# Patient Record
Sex: Male | Born: 2000 | Race: White | Hispanic: No | Marital: Single | Smoking: Never smoker
Health system: Southern US, Community
[De-identification: ages and names within clinical notes are randomized; demographics above are authoritative.]

## PROBLEM LIST (undated history)

## (undated) DIAGNOSIS — J309 Allergic rhinitis, unspecified: Secondary | ICD-10-CM

## (undated) DIAGNOSIS — K219 Gastro-esophageal reflux disease without esophagitis: Secondary | ICD-10-CM

## (undated) DIAGNOSIS — M2141 Flat foot [pes planus] (acquired), right foot: Secondary | ICD-10-CM

## (undated) DIAGNOSIS — M2142 Flat foot [pes planus] (acquired), left foot: Secondary | ICD-10-CM

## (undated) HISTORY — DX: Allergic rhinitis, unspecified: J30.9

## (undated) HISTORY — PX: ADENOIDECTOMY: SUR15

## (undated) HISTORY — DX: Flat foot (pes planus) (acquired), right foot: M21.42

## (undated) HISTORY — DX: Flat foot (pes planus) (acquired), right foot: M21.41

## (undated) HISTORY — DX: Gastro-esophageal reflux disease without esophagitis: K21.9

---
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
## 2022-01-03 DIAGNOSIS — J3081 Allergic rhinitis due to animal (cat) (dog) hair and dander: Secondary | ICD-10-CM | POA: Diagnosis not present

## 2022-01-03 DIAGNOSIS — J3089 Other allergic rhinitis: Secondary | ICD-10-CM | POA: Diagnosis not present

## 2022-01-03 DIAGNOSIS — J301 Allergic rhinitis due to pollen: Secondary | ICD-10-CM | POA: Diagnosis not present

## 2022-01-20 ENCOUNTER — Other Ambulatory Visit (HOSPITAL_COMMUNITY): Payer: Self-pay

## 2022-01-21 ENCOUNTER — Other Ambulatory Visit (HOSPITAL_COMMUNITY): Payer: Self-pay

## 2022-01-22 ENCOUNTER — Other Ambulatory Visit (HOSPITAL_COMMUNITY): Payer: Self-pay

## 2022-01-30 ENCOUNTER — Other Ambulatory Visit (HOSPITAL_COMMUNITY): Payer: Self-pay

## 2022-03-17 ENCOUNTER — Ambulatory Visit (INDEPENDENT_AMBULATORY_CARE_PROVIDER_SITE_OTHER): Payer: 59

## 2022-03-17 ENCOUNTER — Other Ambulatory Visit (HOSPITAL_BASED_OUTPATIENT_CLINIC_OR_DEPARTMENT_OTHER): Payer: Self-pay

## 2022-03-17 ENCOUNTER — Ambulatory Visit: Payer: 59 | Admitting: Podiatry

## 2022-03-17 DIAGNOSIS — M2141 Flat foot [pes planus] (acquired), right foot: Secondary | ICD-10-CM | POA: Diagnosis not present

## 2022-03-17 DIAGNOSIS — M2142 Flat foot [pes planus] (acquired), left foot: Secondary | ICD-10-CM

## 2022-03-17 DIAGNOSIS — M722 Plantar fascial fibromatosis: Secondary | ICD-10-CM

## 2022-03-17 MED ORDER — INFLUENZA VAC SPLIT QUAD 0.5 ML IM SUSY
PREFILLED_SYRINGE | INTRAMUSCULAR | 0 refills | Status: DC
  Filled 2022-03-17: qty 0.5, 1d supply, fill #0

## 2022-03-17 NOTE — Progress Notes (Signed)
dg 

## 2022-03-17 NOTE — Progress Notes (Signed)
   Chief Complaint  Patient presents with   Foot Pain    Patient is here for bilateral foot pain, patient has orthotics and needs new ones.    Subjective:  21 y.o. male presenting today as a new patient with his father for evaluation of bilateral flatfeet.  He has worn orthotics in the past but his orthotics are about 19 years old and several shoe sizes smaller.  He is now in college and he has pain and tenderness associated to a longstanding history of flatfeet.  His father also has flatfeet.  Presenting for further treatment and evaluation   No past medical history on file.     Objective/Physical Exam General: The patient is alert and oriented x3 in no acute distress.  Dermatology: Skin is warm, dry and supple bilateral lower extremities. Negative for open lesions or macerations.  Vascular: Palpable pedal pulses bilaterally. No edema or erythema noted. Capillary refill within normal limits.  Neurological: Epicritic and protective threshold grossly intact bilaterally.   Musculoskeletal Exam: Range of motion within normal limits to all pedal and ankle joints bilateral. Muscle strength 5/5 in all groups bilateral.  Upon weightbearing there is a medial longitudinal arch collapse bilaterally. Remove foot valgus noted to the bilateral lower extremities with excessive pronation upon mid stance.  Radiographic Exam B/L ankles number:  Normal osseous mineralization. Joint spaces preserved. No fracture/dislocation/boney destruction.   Pes planus noted on radiographic exam lateral views. Decreased calcaneal inclination and metatarsal declination angle is noted. Anterior break in the cyma line noted on lateral views.   Assessment: 1.  Reducible/flexible pes planus bilateral   Plan of Care:  1. Patient was evaluated. X-Rays reviewed.  2.  Today the patient was molded for custom molded orthotics 3.  Continue wearing good supportive shoes and sneakers 4.  Return to clinic as needed  *goes  to [employer]. Father is a Software engineer for call   Edrick Kins, DPM [facility]  Dr. Edrick Kins, DPM    To the [address], [place] [address]                Office [phone]  Fax [phone]      Goes to [employer]. Dad is a Software engineer for [employer]

## 2022-03-22 ENCOUNTER — Other Ambulatory Visit (HOSPITAL_BASED_OUTPATIENT_CLINIC_OR_DEPARTMENT_OTHER): Payer: Self-pay

## 2022-04-07 ENCOUNTER — Telehealth: Payer: Self-pay | Admitting: Podiatry

## 2022-04-07 NOTE — Telephone Encounter (Signed)
Left message on cell for pt to call back to schedule appt for picking up orthotics,  Hm # busy signal

## 2022-04-16 ENCOUNTER — Ambulatory Visit (INDEPENDENT_AMBULATORY_CARE_PROVIDER_SITE_OTHER): Payer: 59 | Admitting: *Deleted

## 2022-04-16 DIAGNOSIS — M2141 Flat foot [pes planus] (acquired), right foot: Secondary | ICD-10-CM

## 2022-04-16 NOTE — Progress Notes (Signed)
Patient presents today to pick up custom molded foot orthotics, diagnosed with pes planus by Dr. Logan Bores.   Orthotics were dispensed and fit was satisfactory. Reviewed instructions for break-in and wear. Written instructions given to patient.  Patient will follow up as needed.

## 2022-05-06 ENCOUNTER — Other Ambulatory Visit (HOSPITAL_COMMUNITY): Payer: Self-pay

## 2022-07-24 ENCOUNTER — Other Ambulatory Visit (HOSPITAL_COMMUNITY): Payer: Self-pay

## 2022-12-12 ENCOUNTER — Other Ambulatory Visit (HOSPITAL_COMMUNITY): Payer: Self-pay

## 2022-12-12 MED ORDER — PREVIDENT 5000 BOOSTER PLUS 1.1 % DT PSTE
PASTE | DENTAL | 99 refills | Status: AC
  Filled 2022-12-12: qty 100, 30d supply, fill #0

## 2023-01-07 ENCOUNTER — Ambulatory Visit: Payer: Commercial Managed Care - PPO | Admitting: Family Medicine

## 2023-01-07 ENCOUNTER — Encounter: Payer: Self-pay | Admitting: Family Medicine

## 2023-01-07 VITALS — BP 111/75 | HR 80 | Temp 98.3°F | Ht 74.0 in | Wt 209.4 lb

## 2023-01-07 DIAGNOSIS — Z0000 Encounter for general adult medical examination without abnormal findings: Secondary | ICD-10-CM

## 2023-01-07 DIAGNOSIS — J309 Allergic rhinitis, unspecified: Secondary | ICD-10-CM | POA: Insufficient documentation

## 2023-01-07 NOTE — Progress Notes (Signed)
Office Note 01/07/2023  CC:  Chief Complaint  Patient presents with   Establish Care    HPI:  Frederick Ramirez is a 22 y.o. male who is here to establish care and cpe. Patient's most recent primary MD: Dr. Norris Cross.  His allergist is Dr. Aris Georgia Old records were reviewed prior to or during today's visit.  Feeling well. Sophomore at [employer], living off campus.  Mechanical engineering major. No T/A/Ds.   Past Medical History:  Diagnosis Date   Allergic rhinitis    GERD (gastroesophageal reflux disease)    Pes planus of both feet     Past Surgical History:  Procedure Laterality Date   ADENOIDECTOMY     approx 2010    Family History  Problem Relation Age of Onset   Arthritis Mother    Arthritis Father     Social History   Socioeconomic History   Marital status: Single    Spouse name: Not on file   Number of children: Not on file   Years of education: Not on file   Highest education level: Not on file  Occupational History   Not on file  Tobacco Use   Smoking status: Never   Smokeless tobacco: Never  Substance and Sexual Activity   Alcohol use: Not Currently   Drug use: Never   Sexual activity: Never  Other Topics Concern   Not on file  Social History Narrative   Archivist   Social Determinants of Health   Financial Resource Strain: Not on file  Food Insecurity: Not on file  Transportation Needs: Not on file  Physical Activity: Not on file  Stress: Not on file  Social Connections: Not on file  Intimate Partner Violence: Not on file    Outpatient Encounter Medications as of 01/07/2023  Medication Sig   azelastine (ASTELIN) 0.1 % nasal spray Use 1 spray in each nostril twice a day (Patient taking differently: Place into both nostrils as needed.)   desloratadine (CLARINEX) 5 MG tablet Take 5 mg by mouth.   EPINEPHrine 0.3 mg/0.3 mL IJ SOAJ injection Inject 1 syringe into the outer thigh as needed for anaphylaxis   Fluticasone  Propionate (FLONASE NA) Place 1 spray into the nose as needed.   Sodium Fluoride (PREVIDENT 5000 BOOSTER PLUS) 1.1 % PSTE Brush on teeth twice daily for 2 minutes each time. Spit out as mush as possible and do not swish, eat or drink for 30 minutes after treatment.   [DISCONTINUED] Azelastine HCl 0.15 % SOLN Use 1-2 sprays in each nostril 2 times a day (Patient not taking: Reported on 01/07/2023)   [DISCONTINUED] ciclesonide (OMNARIS) 50 MCG/ACT nasal spray 2 sprays by Each Nare route daily. (Patient not taking: Reported on 01/07/2023)   [DISCONTINUED] COVID-19 mRNA Vac-TriS, Pfizer, (PFIZER-BIONT COVID-19 VAC-TRIS) SUSP injection Inject into the muscle. (Patient not taking: Reported on 01/07/2023)   [DISCONTINUED] desloratadine (CLARINEX) 5 MG tablet Take 1 tablet by mouth once a day (Patient not taking: Reported on 01/07/2023)   [DISCONTINUED] desloratadine (CLARINEX) 5 MG tablet Take 1 tablet (5 mg total) by mouth daily. (Patient not taking: Reported on 01/07/2023)   [DISCONTINUED] influenza vac split quadrivalent PF (FLUARIX) 0.5 ML injection Inject into the muscle. (Patient not taking: Reported on 01/07/2023)   [DISCONTINUED] influenza vac split quadrivalent PF (FLUARIX) 0.5 ML injection Inject into the muscle. (Patient not taking: Reported on 01/07/2023)   [DISCONTINUED] meningococcal B (BEXSERO) SUSY injection Inject 0.5 mLs into the muscle. (Patient not taking: Reported on 01/07/2023)   [  DISCONTINUED] montelukast (SINGULAIR) 5 MG chewable tablet Chew 5 mg by mouth. (Patient not taking: Reported on 01/07/2023)   [DISCONTINUED] Olopatadine HCl 0.6 % SOLN Use 1 to 2  sprays in each nostril twice daily (Patient not taking: Reported on 01/07/2023)   No facility-administered encounter medications on file as of 01/07/2023.    No Known Allergies  Review of Systems  Constitutional:  Negative for appetite change, chills, fatigue and fever.  HENT:  Negative for congestion, dental problem, ear pain and sore  throat.   Eyes:  Negative for discharge, redness and visual disturbance.  Respiratory:  Negative for cough, chest tightness, shortness of breath and wheezing.   Cardiovascular:  Negative for chest pain, palpitations and leg swelling.  Gastrointestinal:  Negative for abdominal pain, blood in stool, diarrhea, nausea and vomiting.  Genitourinary:  Negative for difficulty urinating, dysuria, flank pain, frequency, hematuria and urgency.  Musculoskeletal:  Negative for arthralgias, back pain, joint swelling, myalgias and neck stiffness.  Skin:  Negative for pallor and rash.  Neurological:  Negative for dizziness, speech difficulty, weakness and headaches.  Hematological:  Negative for adenopathy. Does not bruise/bleed easily.  Psychiatric/Behavioral:  Negative for confusion and sleep disturbance. The patient is not nervous/anxious.     PE; Blood pressure 111/75, pulse 80, temperature 98.3 F (36.8 C), temperature source Oral, height 6\' 2"  (1.88 m), weight 209 lb 6.4 oz (95 kg), SpO2 98%. Body mass index is 26.89 kg/m.  Physical Exam  Gen: Alert, well appearing.  Patient is oriented to person, place, time, and situation. AFFECT: pleasant, lucid thought and speech. ENT: Ears: EACs clear, normal epithelium.  TMs with good light reflex and landmarks bilaterally.  Eyes: no injection, icteris, swelling, or exudate.  EOMI, PERRLA. Nose: no drainage or turbinate edema/swelling.  No injection or focal lesion.  Mouth: lips without lesion/swelling.  Oral mucosa pink and moist.  Dentition intact and without obvious caries or gingival swelling.  Oropharynx without erythema, exudate, or swelling.  Neck: supple/nontender.  No LAD, mass, or TM.  Carotid pulses 2+ bilaterally, without bruits. CV: RRR, no m/r/g.   LUNGS: CTA bilat, nonlabored resps, good aeration in all lung fields. ABD: soft, NT, ND, BS normal.  No hepatospenomegaly or mass.  No bruits. EXT: no clubbing, cyanosis, or edema.  Musculoskeletal:  no joint swelling, erythema, warmth, or tenderness.  ROM of all joints intact. Skin - no sores or suspicious lesions or rashes or color changes  Pertinent labs:   none  ASSESSMENT AND PLAN:   New patient, establishing care.  Health maintenance exam: Reviewed age and gender appropriate health maintenance issues (prudent diet, regular exercise, health risks of tobacco and excessive alcohol, use of seatbelts, fire alarms in home, use of sunscreen).  Also reviewed age and gender appropriate health screening as well as vaccine recommendations. Vaccines: Tdap due 2025.  Pt declined HPV vaccine.  He is otherwise all UTD. Labs: none An After Visit Summary was printed and given to the patient.  Return in about 1 year (around 01/07/2024) for cpe.  Signed:  Santiago Bumpers, MD           01/07/2023

## 2023-01-07 NOTE — Patient Instructions (Signed)
Health Maintenance, Male Adopting a healthy lifestyle and getting preventive care are important in promoting health and wellness. Ask your health care provider about: The right schedule for you to have regular tests and exams. Things you can do on your own to prevent diseases and keep yourself healthy. What should I know about diet, weight, and exercise? Eat a healthy diet  Eat a diet that includes plenty of vegetables, fruits, low-fat dairy products, and lean protein. Do not eat a lot of foods that are high in solid fats, added sugars, or sodium. Maintain a healthy weight Body mass index (BMI) is a measurement that can be used to identify possible weight problems. It estimates body fat based on height and weight. Your health care provider can help determine your BMI and help you achieve or maintain a healthy weight. Get regular exercise Get regular exercise. This is one of the most important things you can do for your health. Most adults should: Exercise for at least 150 minutes each week. The exercise should increase your heart rate and make you sweat (moderate-intensity exercise). Do strengthening exercises at least twice a week. This is in addition to the moderate-intensity exercise. Spend less time sitting. Even light physical activity can be beneficial. Watch cholesterol and blood lipids Have your blood tested for lipids and cholesterol at 20 years of age, then have this test every 5 years. You may need to have your cholesterol levels checked more often if: Your lipid or cholesterol levels are high. You are older than 20 years of age. You are at high risk for heart disease. What should I know about cancer screening? Many types of cancers can be detected early and may often be prevented. Depending on your health history and family history, you may need to have cancer screening at various ages. This may include screening for: Colorectal cancer. Prostate cancer. Skin cancer. Lung  cancer. What should I know about heart disease, diabetes, and high blood pressure? Blood pressure and heart disease High blood pressure causes heart disease and increases the risk of stroke. This is more likely to develop in people who have high blood pressure readings or are overweight. Talk with your health care provider about your target blood pressure readings. Have your blood pressure checked: Every 3-5 years if you are 18-39 years of age. Every year if you are 40 years old or older. If you are between the ages of 65 and 75 and are a current or former smoker, ask your health care provider if you should have a one-time screening for abdominal aortic aneurysm (AAA). Diabetes Have regular diabetes screenings. This checks your fasting blood sugar level. Have the screening done: Once every three years after age 45 if you are at a normal weight and have a low risk for diabetes. More often and at a younger age if you are overweight or have a high risk for diabetes. What should I know about preventing infection? Hepatitis B If you have a higher risk for hepatitis B, you should be screened for this virus. Talk with your health care provider to find out if you are at risk for hepatitis B infection. Hepatitis C Blood testing is recommended for: Everyone born from 1945 through 1965. Anyone with known risk factors for hepatitis C. Sexually transmitted infections (STIs) You should be screened each year for STIs, including gonorrhea and chlamydia, if: You are sexually active and are younger than 20 years of age. You are older than 20 years of age and your   health care provider tells you that you are at risk for this type of infection. Your sexual activity has changed since you were last screened, and you are at increased risk for chlamydia or gonorrhea. Ask your health care provider if you are at risk. Ask your health care provider about whether you are at high risk for HIV. Your health care provider  may recommend a prescription medicine to help prevent HIV infection. If you choose to take medicine to prevent HIV, you should first get tested for HIV. You should then be tested every 3 months for as long as you are taking the medicine. Follow these instructions at home: Alcohol use Do not drink alcohol if your health care provider tells you not to drink. If you drink alcohol: Limit how much you have to 0-2 drinks a day. Know how much alcohol is in your drink. In the [place] one drink equals one 12 oz bottle of beer (355 mL), one 5 oz glass of wine (148 mL), or one 1 oz glass of hard liquor (44 mL). Lifestyle Do not use any products that contain nicotine or tobacco. These products include cigarettes, chewing tobacco, and vaping devices, such as e-cigarettes. If you need help quitting, ask your health care provider. Do not use street drugs. Do not share needles. Ask your health care provider for help if you need support or information about quitting drugs. General instructions Schedule regular health, dental, and eye exams. Stay current with your vaccines. Tell your health care provider if: You often feel depressed. You have ever been abused or do not feel safe at home. Summary Adopting a healthy lifestyle and getting preventive care are important in promoting health and wellness. Follow your health care provider's instructions about healthy diet, exercising, and getting tested or screened for diseases. Follow your health care provider's instructions on monitoring your cholesterol and blood pressure. This information is not intended to replace advice given to you by your health care provider. Make sure you discuss any questions you have with your health care provider. Document Revised: 10/01/2020 Document Reviewed: 10/01/2020 Elsevier Patient Education  2024 [employer]  

## 2023-03-02 ENCOUNTER — Other Ambulatory Visit (HOSPITAL_BASED_OUTPATIENT_CLINIC_OR_DEPARTMENT_OTHER): Payer: Self-pay

## 2023-03-02 MED ORDER — INFLUENZA VIRUS VACC SPLIT PF (FLUZONE) 0.5 ML IM SUSY
0.5000 mL | PREFILLED_SYRINGE | Freq: Once | INTRAMUSCULAR | 0 refills | Status: AC
  Filled 2023-03-02: qty 0.5, 1d supply, fill #0

## 2023-03-12 ENCOUNTER — Other Ambulatory Visit (HOSPITAL_COMMUNITY): Payer: Self-pay

## 2023-08-05 DIAGNOSIS — M25571 Pain in right ankle and joints of right foot: Secondary | ICD-10-CM | POA: Diagnosis not present

## 2023-08-07 ENCOUNTER — Ambulatory Visit: Admitting: Sports Medicine

## 2023-08-07 ENCOUNTER — Ambulatory Visit

## 2023-08-07 ENCOUNTER — Encounter: Payer: Self-pay | Admitting: Sports Medicine

## 2023-08-07 ENCOUNTER — Other Ambulatory Visit (HOSPITAL_COMMUNITY): Payer: Self-pay

## 2023-08-07 VITALS — BP 145/75 | HR 72 | Temp 98.1°F | Resp 16 | Ht 75.0 in | Wt 227.0 lb

## 2023-08-07 DIAGNOSIS — M79671 Pain in right foot: Secondary | ICD-10-CM | POA: Diagnosis not present

## 2023-08-07 DIAGNOSIS — M25571 Pain in right ankle and joints of right foot: Secondary | ICD-10-CM

## 2023-08-07 DIAGNOSIS — G8929 Other chronic pain: Secondary | ICD-10-CM | POA: Insufficient documentation

## 2023-08-07 DIAGNOSIS — M2141 Flat foot [pes planus] (acquired), right foot: Secondary | ICD-10-CM | POA: Diagnosis not present

## 2023-08-07 MED ORDER — MELOXICAM 15 MG PO TABS
ORAL_TABLET | ORAL | 3 refills | Status: AC
  Filled 2023-08-07: qty 30, 30d supply, fill #0

## 2023-08-07 NOTE — Progress Notes (Signed)
    Procedures performed today:    None.  Independent interpretation of notes and tests performed by another provider:   None.  Brief History, Exam, Impression, and Recommendations:    Pain in joint, ankle and foot, right This is a very pleasant previously healthy 23 year old male, he has a long history of right foot and ankle discomfort, he has known severe pes planus, he did work with a podiatrist for some time and was able to get some custom molded inserts, unfortunately these did not fully correct his pes planus, he is in college and when walking around school he has severe pain that he localizes more laterally at the ankle more so than medially at the navicular. He is unable to invert his ankle actively to neutral. On exam he does have tenderness laterally at the cuboid, calcaneus and sinus tarsi, he has no discomfort medially at the navicular or the tibialis posterior. Using gentle but firm constant pressure I was able to invert his ankle to neutral and slightly past. He did have some clonus with attempting to move his foot to medial, this raised the suspicion for muscle spasticity. 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 At this point he has had discomfort for greater than 6 weeks, he has had conservative treatment including home physical therapy with podiatry. We will start conservatively, x-rays, foot ankle MRIs, I will place him in a cam boot for concern for stress fracture. I added scaphoid pads to his custom orthotics, he was able to ambulate without discomfort. We also added formal physical therapy, I think he needs aggressive therapy on his ankle and foot inverters. If insufficient improvement after 6 weeks of therapy and if he continues to have some spasticity we will consider imaging of his lumbar spine as well as nerve conduction/EMG. I think the ultimate treatment here is going to require aggressive orthotics with arch support bring him to neutral and continued physical  therapy.    ____________________________________________ Frederick Ramirez. Frederick Ramirez, M.D., ABFM., CAQSM., AME. Primary Care and Sports Medicine [facility]  Adjunct Professor of Family Medicine  [place] [employer] [employer]  Restaurant manager, fast food

## 2023-08-07 NOTE — Assessment & Plan Note (Signed)
 This is a very pleasant previously healthy 23 year old male, he has a long history of right foot and ankle discomfort, he has known severe pes planus, he did work with a podiatrist for some time and was able to get some custom molded inserts, unfortunately these did not fully correct his pes planus, he is in college and when walking around school he has severe pain that he localizes more laterally at the ankle more so than medially at the navicular. He is unable to invert his ankle actively to neutral. On exam he does have tenderness laterally at the cuboid, calcaneus and sinus tarsi, he has no discomfort medially at the navicular or the tibialis posterior. Using gentle but firm constant pressure I was able to invert his ankle to neutral and slightly past. He did have some clonus with attempting to move his foot to medial, this raised the suspicion for muscle spasticity. 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 At this point he has had discomfort for greater than 6 weeks, he has had conservative treatment including home physical therapy with podiatry. We will start conservatively, x-rays, foot ankle MRIs, I will place him in a cam boot for concern for stress fracture. I added scaphoid pads to his custom orthotics, he was able to ambulate without discomfort. We also added formal physical therapy, I think he needs aggressive therapy on his ankle and foot inverters. If insufficient improvement after 6 weeks of therapy and if he continues to have some spasticity we will consider imaging of his lumbar spine as well as nerve conduction/EMG. I think the ultimate treatment here is going to require aggressive orthotics with arch support bring him to neutral and continued physical therapy.

## 2023-08-15 ENCOUNTER — Ambulatory Visit

## 2023-08-15 DIAGNOSIS — M25571 Pain in right ankle and joints of right foot: Secondary | ICD-10-CM | POA: Diagnosis not present

## 2023-08-15 DIAGNOSIS — M25471 Effusion, right ankle: Secondary | ICD-10-CM | POA: Diagnosis not present

## 2023-08-15 DIAGNOSIS — M2141 Flat foot [pes planus] (acquired), right foot: Secondary | ICD-10-CM | POA: Diagnosis not present

## 2023-08-15 DIAGNOSIS — M25474 Effusion, right foot: Secondary | ICD-10-CM | POA: Diagnosis not present

## 2023-09-06 ENCOUNTER — Encounter: Payer: Self-pay | Admitting: Sports Medicine

## 2023-09-18 ENCOUNTER — Ambulatory Visit: Admitting: Physical Therapy

## 2023-09-18 ENCOUNTER — Ambulatory Visit: Admitting: Sports Medicine

## 2023-09-18 DIAGNOSIS — M2141 Flat foot [pes planus] (acquired), right foot: Secondary | ICD-10-CM

## 2023-09-18 DIAGNOSIS — M25571 Pain in right ankle and joints of right foot: Secondary | ICD-10-CM

## 2023-09-18 DIAGNOSIS — M2142 Flat foot [pes planus] (acquired), left foot: Secondary | ICD-10-CM | POA: Diagnosis not present

## 2023-09-18 NOTE — Therapy (Signed)
 OUTPATIENT PHYSICAL THERAPY LOWER EXTREMITY EVALUATION   Patient Name: Frederick Ramirez MRN: [id number] DOB:07/14/2000, 23 y.o., male  Today's Date: 09/18/2023  END OF SESSION:  PT End of Session - 09/20/23 0845     Visit Number 1    Number of Visits 16    Date for PT Re-Evaluation 11/13/23    Authorization Type [facility]    PT Start Time 1147    PT Stop Time 1223    PT Time Calculation (min) 36 min    Activity Tolerance Patient tolerated treatment well    Behavior During Therapy WFL for tasks assessed/performed             Past Medical History:  Diagnosis Date   Allergic rhinitis    GERD (gastroesophageal reflux disease)    Pes planus of both feet    Past Surgical History:  Procedure Laterality Date   ADENOIDECTOMY     approx 2010   Patient Active Problem List   Diagnosis Date Noted   Pain in joint, ankle and foot, right 08/07/2023   Allergic rhinitis 01/07/2023     PCP: McGowen, Phillip`  REFERRING PROVIDER:  Annemarie Kil  REFERRING DIAG: Pain in r ankle   THERAPY DIAG:  Pain in right ankle and joints of right foot  Pes planus of both feet  Rationale for Evaluation and Treatment: Rehabilitation  ONSET DATE:    SUBJECTIVE:   SUBJECTIVE STATEMENT: Pt states ongoing pain in R foot/ankle. States it has been painful for a long time, but has changed a bit. He used to have more pain in arch from flat foot, but in last year or so has been in lateral ankle. States increased pain since about February where he did more walking for several days. He has been wearing boot for about 6 weeks, today is end of 6 weeks, and he is wearing lace up ankle brace for driving purposes. He goes to school in [place] but will be back in town this summer starting in 2 weeks. He does lots of walking at college which he finds to be very painful. 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 He is currently wearing  [employer] shoes, also has work boots. He has custom orthotics that are comfortable at this time.    PERTINENT HISTORY: N/A  PAIN:  Are you having pain? Yes: NPRS scale: 4 /10 Pain location: lateral anklel Pain description: sore,  Aggravating factors: standing, walking  Relieving factors: rest  PRECAUTIONS: None  WEIGHT BEARING RESTRICTIONS: No  FALLS:  Has patient fallen in last 6 months? No  PLOF: Independent  PATIENT GOALS:  Decreased pain In ankle.   NEXT MD VISIT:   OBJECTIVE:   DIAGNOSTIC FINDINGS:   PATIENT SURVEYS:    COGNITION: Overall cognitive status: Within functional limits for tasks assessed     SENSATION: WFL  EDEMA:   POSTURE:  bare feet:  Pes planus bil R>L.  R with severe over pronation and navicular drop.  Shoes on: moderate/significant improvement of alignment for R foot/ankle with orthotic and sneakers on.   PALPATION: No pain to palpate lateral ankle on R.   LOWER EXTREMITY ROM: Hips/knees: WFL L ankle: WFL R ankle: limited arom for INV  LOWER EXTREMITY MMT:  MMT Left eval Right  eval  Hip flexion    Hip extension    Hip abduction    Hip adduction    Hip internal rotation  Hip external rotation    Knee flexion    Knee extension    Ankle dorsiflexion    Ankle plantarflexion    Ankle inversion    Ankle eversion     (Blank rows = not tested)  LOWER EXTREMITY SPECIAL TESTS:    FUNCTIONAL TESTS:  SLS:  significant sway on R, moderate on L.   GAIT:   TODAY'S TREATMENT:                                                                                                                              DATE:     PATIENT EDUCATION:  Education details: PT POC, Exam findings, HEP Person educated: Patient Education method: Explanation, Demonstration, Tactile cues, Verbal cues, and Handouts Education comprehension: verbalized understanding, returned demonstration, verbal cues required, tactile cues required, and needs further  education   HOME EXERCISE PROGRAM: Access Code: [email or web address] URL: [email or web address] Date: 09/20/2023 Prepared by: Terrilee Few  Exercises - Supine Bridge  - 1 x daily - 4-5 x weekly - 2 sets - 10 reps - Seated Heel Raise  - 1 x daily - 4-5 x weekly - 2 sets - 10 reps - Supine Ankle Inversion AROM  - 1 x daily - 4-5 x weekly - 2 sets - 10 reps  ASSESSMENT:  CLINICAL IMPRESSION: Patient presents with primary complaint of  pain in R lateral ankle. He has significant over pronation on R which is likely cause of pain. He has lack of effective HEP for his Ongoing pain. He is getting good positional alignment from current orthotics and shoes, but would benefit from updated footwear, recommendations given today. He has poor ability for Inversion ROM and poor strength, and will benefit from overall strength and stability for bil foot/ankle. He will start to transition out of boot, discussed weaning out slowly as pain allows, pt states understanding.  Pt with decreased ability for full functional activities, walking, running and exercise.  Pt will  benefit from skilled PT to improve deficits and pain and to return to PLOF.   OBJECTIVE IMPAIRMENTS: Abnormal gait, decreased activity tolerance, decreased ROM, decreased strength, impaired flexibility, improper body mechanics, and pain.   ACTIVITY LIMITATIONS: standing, squatting, stairs, and locomotion level  PARTICIPATION LIMITATIONS: driving, shopping, community activity, occupation, and yard work  PERSONAL FACTORS: Past/current experiences and Time since onset of injury/illness/exacerbation are also affecting patient's functional outcome.   REHAB POTENTIAL: Good  CLINICAL DECISION MAKING: Stable/uncomplicated  EVALUATION COMPLEXITY: Low   GOALS: Goals reviewed with patient? Yes  SHORT TERM GOALS: Target date: 10/02/23  Pt to be independent with initial HEP  Goal status: INITIAL  2.  Pt to demo ability for active  inversion ROM  Goal status: INITIAL    LONG TERM GOALS: Target date: 11/13/23  Pt to be independent with final HEP  Goal status: INITIAL  2.  Pt to demo optimal mechanics with standing heel raise  Goal status: INITIAL  3.  Pt  to demo max strength and stability for bil foot/ankle, to improve gait pattern  Goal status: INITIAL  4.  Pt to be compliant with footwear and/or orthotics as appropriate for his foot type , for optimal alignment and pressure relief.   Goal status: INITIAL  5.  Pt to report decreased pain levels in R foot  to 0-2/10 to improve ability for exercise and running.   Goal status: INITIAL   PLAN:  PT FREQUENCY: 1-2x/week  PT DURATION: 8 weeks  PLANNED INTERVENTIONS: Therapeutic exercises, Therapeutic activity, Neuromuscular re-education, Patient/Family education, Self Care, Joint mobilization, Joint manipulation, Stair training, Orthotic/Fit training, DME instructions, Aquatic Therapy, Dry Needling, Electrical stimulation, Cryotherapy, Moist heat, Taping, Ultrasound, Ionotophoresis 4mg /ml Dexamethasone, Manual therapy,  Vasopneumatic device, Traction, Spinal manipulation, Spinal mobilization,Balance training, Gait training,   PLAN FOR NEXT SESSION:    Terrilee Few, PT, DPT 10:16 AM  09/20/23

## 2023-09-20 ENCOUNTER — Encounter: Payer: Self-pay | Admitting: Physical Therapy

## 2023-10-05 ENCOUNTER — Encounter: Payer: Self-pay | Admitting: Physical Therapy

## 2023-10-05 ENCOUNTER — Ambulatory Visit: Admitting: Physical Therapy

## 2023-10-05 ENCOUNTER — Encounter (HOSPITAL_COMMUNITY): Payer: Self-pay

## 2023-10-05 DIAGNOSIS — M25571 Pain in right ankle and joints of right foot: Secondary | ICD-10-CM

## 2023-10-05 DIAGNOSIS — M2141 Flat foot [pes planus] (acquired), right foot: Secondary | ICD-10-CM | POA: Diagnosis not present

## 2023-10-05 DIAGNOSIS — M2142 Flat foot [pes planus] (acquired), left foot: Secondary | ICD-10-CM | POA: Diagnosis not present

## 2023-10-05 NOTE — Therapy (Signed)
 OUTPATIENT PHYSICAL THERAPY LOWER EXTREMITY TREATMENT   Patient Name: Frederick Ramirez MRN: [id number] DOB:[birth year], 23 y.o., male  Today's Date: 10/05/2023  END OF SESSION:  PT End of Session - 10/05/23 1339     Visit Number 2    Number of Visits 16    Date for PT Re-Evaluation 11/13/23    Authorization Type [facility]    PT Start Time 1345    PT Stop Time 1430    PT Time Calculation (min) 45 min    Activity Tolerance Patient tolerated treatment well    Behavior During Therapy WFL for tasks assessed/performed             Past Medical History:  Diagnosis Date   Allergic rhinitis    GERD (gastroesophageal reflux disease)    Pes planus of both feet    Past Surgical History:  Procedure Laterality Date   ADENOIDECTOMY     approx 2010   Patient Active Problem List   Diagnosis Date Noted   Pain in joint, ankle and foot, right 08/07/2023   Allergic rhinitis 01/07/2023     PCP: Johnette Naegeli, Phillip`  REFERRING PROVIDER:  Annemarie Kil  REFERRING DIAG: Pain in r ankle   THERAPY DIAG:  Pain in right ankle and joints of right foot  Pes planus of both feet  Rationale for Evaluation and Treatment: Rehabilitation  ONSET DATE:    SUBJECTIVE:   SUBJECTIVE STATEMENT: Pt states doing ok, he is done with school, started working, will be working full time, standing/walking. He is wearing ankle brace with good comfort. Minimal pain, but did twist ankle last week as he was walking on uneven ground, and states some soreness for a few days after that.   Eval: Pt states ongoing pain in R foot/ankle. States it has been painful for a long time, but has changed a bit. He used to have more pain in arch from flat foot, but in last year or so has been in lateral ankle. States increased pain since about February where he did more walking for several days. He has been wearing boot for about 6 weeks, today is end of 6 weeks, and he is wearing lace up ankle brace for driving  purposes. He goes to school in [place] but will be back in town this summer starting in 2 weeks. He does lots of walking at college which he finds to be very painful. 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 He is currently wearing [employer] shoes, also has work boots. He has custom orthotics that are comfortable at this time.    PERTINENT HISTORY: N/A  PAIN:  Are you having pain? Yes: NPRS scale: 4 /10 Pain location: lateral anklel Pain description: sore,  Aggravating factors: standing, walking  Relieving factors: rest  PRECAUTIONS: None  WEIGHT BEARING RESTRICTIONS: No  FALLS:  Has patient fallen in last 6 months? No  PLOF: Independent  PATIENT GOALS:  Decreased pain In ankle.   NEXT MD VISIT:   OBJECTIVE:   DIAGNOSTIC FINDINGS:   PATIENT SURVEYS:    COGNITION: Overall cognitive status: Within functional limits for tasks assessed     SENSATION: WFL  EDEMA:   POSTURE:  bare feet:  Pes planus bil R>L.  R with severe over pronation and navicular drop.  Shoes on: moderate/significant improvement of alignment for R foot/ankle with orthotic and sneakers on.   PALPATION: No pain to palpate lateral ankle on R.  LOWER EXTREMITY ROM: Hips/knees: WFL L ankle: WFL R ankle: limited arom for INV  LOWER EXTREMITY MMT:  MMT Left eval Right  eval  Hip flexion    Hip extension    Hip abduction    Hip adduction    Hip internal rotation    Hip external rotation    Knee flexion    Knee extension    Ankle dorsiflexion    Ankle plantarflexion    Ankle inversion    Ankle eversion     (Blank rows = not tested)  LOWER EXTREMITY SPECIAL TESTS:    FUNCTIONAL TESTS:  SLS:  significant sway on R, moderate on L.   GAIT:   TODAY'S TREATMENT:                                                                                                                              DATE:   10/05/2023 Therapeutic  Exercise: Aerobic: bike L2 x 7 min  Supine: bridging 2 x 10  cuing for foot position; prom for L inv rom.  S/L: hip clams x 15 bil;  Seated:  heel raises x 20;  INV RTB x 20;  Standing: heel raises 3 x 10- education on form and mechanics; arch strength (c's) x 20;  Stretches:  Neuromuscular Re-education: Manual Therapy: Therapeutic Activity: Squats with mirror for body position and equal weight bearing in feet 2 x 10;  Self Care:   PATIENT EDUCATION:  Education details: updated and reviewed HEP.  Person educated: Patient Education method: Explanation, Demonstration, Tactile cues, Verbal cues, and Handouts Education comprehension: verbalized understanding, returned demonstration, verbal cues required, tactile cues required, and needs further education   HOME EXERCISE PROGRAM: Access Code: [email or web address] URL: [email or web address] Date: 09/20/2023 Prepared by: Terrilee Few  Exercises - Supine Bridge  - 1 x daily - 4-5 x weekly - 2 sets - 10 reps - Seated Heel Raise  - 1 x daily - 4-5 x weekly - 2 sets - 10 reps - Supine Ankle Inversion AROM  - 1 x daily - 4-5 x weekly - 2 sets - 10 reps  ASSESSMENT:  CLINICAL IMPRESSION: Pt with good tolerance for activities today. He initially had stiffness and difficulty with inversion ROM with mild pain, but improved as session went on and foot loosened up. Able to perform rest of exercises pain free today. He is challenged with strengthening and heel raises due to weakness, and will benefit from progressive strength as able.   Eval: Patient presents with primary complaint of  pain in R lateral ankle. He has significant over pronation on R which is likely cause of pain. He has lack of effective HEP for his Ongoing pain. He is getting good positional alignment from current orthotics and shoes, but would benefit from updated footwear, recommendations given today. He has poor ability for Inversion ROM and poor strength, and will benefit  from overall strength and stability for bil foot/ankle. He will start to  transition out of boot, discussed weaning out slowly as pain allows, pt states understanding.  Pt with decreased ability for full functional activities, walking, running and exercise.  Pt will  benefit from skilled PT to improve deficits and pain and to return to PLOF.   OBJECTIVE IMPAIRMENTS: Abnormal gait, decreased activity tolerance, decreased ROM, decreased strength, impaired flexibility, improper body mechanics, and pain.   ACTIVITY LIMITATIONS: standing, squatting, stairs, and locomotion level  PARTICIPATION LIMITATIONS: driving, shopping, community activity, occupation, and yard work  PERSONAL FACTORS: Past/current experiences and Time since onset of injury/illness/exacerbation are also affecting patient's functional outcome.   REHAB POTENTIAL: Good  CLINICAL DECISION MAKING: Stable/uncomplicated  EVALUATION COMPLEXITY: Low   GOALS: Goals reviewed with patient? Yes  SHORT TERM GOALS: Target date: 10/02/23  Pt to be independent with initial HEP  Goal status: INITIAL  2.  Pt to demo ability for active inversion ROM  Goal status: INITIAL    LONG TERM GOALS: Target date: 11/13/23  Pt to be independent with final HEP  Goal status: INITIAL  2.  Pt to demo optimal mechanics with standing heel raise  Goal status: INITIAL  3.  Pt to demo max strength and stability for bil foot/ankle, to improve gait pattern  Goal status: INITIAL  4.  Pt to be compliant with footwear and/or orthotics as appropriate for his foot type , for optimal alignment and pressure relief.   Goal status: INITIAL  5.  Pt to report decreased pain levels in R foot  to 0-2/10 to improve ability for exercise and running.   Goal status: INITIAL   PLAN:  PT FREQUENCY: 1-2x/week  PT DURATION: 8 weeks  PLANNED INTERVENTIONS: Therapeutic exercises, Therapeutic activity, Neuromuscular re-education, Patient/Family education, Self  Care, Joint mobilization, Joint manipulation, Stair training, Orthotic/Fit training, DME instructions, Aquatic Therapy, Dry Needling, Electrical stimulation, Cryotherapy, Moist heat, Taping, Ultrasound, Ionotophoresis 4mg /ml Dexamethasone, Manual therapy,  Vasopneumatic device, Traction, Spinal manipulation, Spinal mobilization,Balance training, Gait training,   PLAN FOR NEXT SESSION:    Terrilee Few, PT, DPT 1:40 PM  10/05/23

## 2023-10-07 ENCOUNTER — Encounter: Payer: Self-pay | Admitting: Physical Therapy

## 2023-10-07 ENCOUNTER — Ambulatory Visit: Admitting: Physical Therapy

## 2023-10-07 DIAGNOSIS — M25571 Pain in right ankle and joints of right foot: Secondary | ICD-10-CM | POA: Diagnosis not present

## 2023-10-07 DIAGNOSIS — M2141 Flat foot [pes planus] (acquired), right foot: Secondary | ICD-10-CM

## 2023-10-07 DIAGNOSIS — M2142 Flat foot [pes planus] (acquired), left foot: Secondary | ICD-10-CM

## 2023-10-07 NOTE — Therapy (Signed)
 OUTPATIENT PHYSICAL THERAPY LOWER EXTREMITY TREATMENT   Patient Name: Frederick Ramirez MRN: [id number] DOB:[birth year], 23 y.o., male  Today's Date: 10/07/2023  END OF SESSION:  PT End of Session - 10/07/23 1039     Visit Number 3    Number of Visits 16    Date for PT Re-Evaluation 11/13/23    Authorization Type [facility]    PT Start Time 0850    PT Stop Time 0931    PT Time Calculation (min) 41 min    Activity Tolerance Patient tolerated treatment well    Behavior During Therapy WFL for tasks assessed/performed              Past Medical History:  Diagnosis Date   Allergic rhinitis    GERD (gastroesophageal reflux disease)    Pes planus of both feet    Past Surgical History:  Procedure Laterality Date   ADENOIDECTOMY     approx 2010   Patient Active Problem List   Diagnosis Date Noted   Pain in joint, ankle and foot, right 08/07/2023   Allergic rhinitis 01/07/2023     PCP: McGowen, Phillip`  REFERRING PROVIDER:  Annemarie Kil  REFERRING DIAG: Pain in r ankle   THERAPY DIAG:  Pain in right ankle and joints of right foot  Pes planus of both feet  Rationale for Evaluation and Treatment: Rehabilitation  ONSET DATE:    SUBJECTIVE:   SUBJECTIVE STATEMENT: Pt states doing ok, no pain today. He did work long hours (almost 12) yesterday, standing, and did have some pain at end of day. He did some stretching/hep after work, and feels ok this am.   Eval: Pt states ongoing pain in R foot/ankle. States it has been painful for a long time, but has changed a bit. He used to have more pain in arch from flat foot, but in last year or so has been in lateral ankle. States increased pain since about February where he did more walking for several days. He has been wearing boot for about 6 weeks, today is end of 6 weeks, and he is wearing lace up ankle brace for driving purposes. He goes to school in [place] but will be back in town this summer starting in 2 weeks.  He does lots of walking at college which he finds to be very painful. 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 He is currently wearing [employer] shoes, also has work boots. He has custom orthotics that are comfortable at this time.    PERTINENT HISTORY: N/A  PAIN:  Are you having pain? Yes: NPRS scale: 4 /10 Pain location: lateral anklel Pain description: sore,  Aggravating factors: standing, walking  Relieving factors: rest  PRECAUTIONS: None  WEIGHT BEARING RESTRICTIONS: No  FALLS:  Has patient fallen in last 6 months? No  PLOF: Independent  PATIENT GOALS:  Decreased pain In ankle.   NEXT MD VISIT:   OBJECTIVE:   DIAGNOSTIC FINDINGS:   PATIENT SURVEYS:    COGNITION: Overall cognitive status: Within functional limits for tasks assessed     SENSATION: WFL  EDEMA:   POSTURE:  bare feet:  Pes planus bil R>L.  R with severe over pronation and navicular drop.  Shoes on: moderate/significant improvement of alignment for R foot/ankle with orthotic and sneakers on.   PALPATION: No pain to palpate lateral ankle on R.   LOWER EXTREMITY ROM: Hips/knees: WFL L ankle: WFL R ankle: limited arom  for INV  LOWER EXTREMITY MMT:  MMT Left eval Right  eval  Hip flexion    Hip extension    Hip abduction    Hip adduction    Hip internal rotation    Hip external rotation    Knee flexion    Knee extension    Ankle dorsiflexion    Ankle plantarflexion    Ankle inversion    Ankle eversion     (Blank rows = not tested)  LOWER EXTREMITY SPECIAL TESTS:    FUNCTIONAL TESTS:  SLS:  significant sway on R, moderate on L.   GAIT:   TODAY'S TREATMENT:                                                                                                                              DATE:   10/07/2023 Therapeutic Exercise: Aerobic: bike L2 x 7 min  Supine: bridging  with GTB 2 x 10  ;  arom inv x 15;   arom pf/df  x 15;  prom for L inv rom.  Seated:    INV RTB x 20;  Standing: heel raises 3 x 10- education on form and mechanics;  gastroc step at stair 20 sec x 3- if calf sore from heel raises;   (arch strength (c's) x 15;  lateral squat walk with GTB x 6 for hip strength;  Stretches:  Neuromuscular Re-education: Manual Therapy: Therapeutic Activity:  Self Care:   PATIENT EDUCATION:  Education details: updated and reviewed HEP.  Person educated: Patient Education method: Explanation, Demonstration, Tactile cues, Verbal cues, and Handouts Education comprehension: verbalized understanding, returned demonstration, verbal cues required, tactile cues required, and needs further education   HOME EXERCISE PROGRAM: Access Code: [email or web address] URL: [email or web address] Date: 09/20/2023 Prepared by: Terrilee Few  Exercises - Supine Bridge  - 1 x daily - 4-5 x weekly - 2 sets - 10 reps - Seated Heel Raise  - 1 x daily - 4-5 x weekly - 2 sets - 10 reps - Supine Ankle Inversion AROM  - 1 x daily - 4-5 x weekly - 2 sets - 10 reps  ASSESSMENT:  CLINICAL IMPRESSION: Pt with good tolerance for activities today. Mild improvement in ability for active Inversion and strengthening for this motion. Noted atrophy in R gastroc vs L, will benefit from continued strength for heel raises and calf. Pt states he would like to play lacrosse next year at college, will continue to discuss options for cleats and inserts.   Eval: Patient presents with primary complaint of  pain in R lateral ankle. He has significant over pronation on R which is likely cause of pain. He has lack of effective HEP for his Ongoing pain. He is getting good positional alignment from current orthotics and shoes, but would benefit from updated footwear, recommendations given today. He has poor ability for Inversion ROM and poor strength, and will benefit from overall strength and stability for bil foot/ankle. He will  start to transition out  of boot, discussed weaning out slowly as pain allows, pt states understanding.  Pt with decreased ability for full functional activities, walking, running and exercise.  Pt will  benefit from skilled PT to improve deficits and pain and to return to PLOF.   OBJECTIVE IMPAIRMENTS: Abnormal gait, decreased activity tolerance, decreased ROM, decreased strength, impaired flexibility, improper body mechanics, and pain.   ACTIVITY LIMITATIONS: standing, squatting, stairs, and locomotion level  PARTICIPATION LIMITATIONS: driving, shopping, community activity, occupation, and yard work  PERSONAL FACTORS: Past/current experiences and Time since onset of injury/illness/exacerbation are also affecting patient's functional outcome.   REHAB POTENTIAL: Good  CLINICAL DECISION MAKING: Stable/uncomplicated  EVALUATION COMPLEXITY: Low   GOALS: Goals reviewed with patient? Yes  SHORT TERM GOALS: Target date: 10/02/23  Pt to be independent with initial HEP  Goal status: INITIAL  2.  Pt to demo ability for active inversion ROM  Goal status: INITIAL    LONG TERM GOALS: Target date: 11/13/23  Pt to be independent with final HEP  Goal status: INITIAL  2.  Pt to demo optimal mechanics with standing heel raise  Goal status: INITIAL  3.  Pt to demo max strength and stability for bil foot/ankle, to improve gait pattern  Goal status: INITIAL  4.  Pt to be compliant with footwear and/or orthotics as appropriate for his foot type , for optimal alignment and pressure relief.   Goal status: INITIAL  5.  Pt to report decreased pain levels in R foot  to 0-2/10 to improve ability for exercise and running.   Goal status: INITIAL   PLAN:  PT FREQUENCY: 1-2x/week  PT DURATION: 8 weeks  PLANNED INTERVENTIONS: Therapeutic exercises, Therapeutic activity, Neuromuscular re-education, Patient/Family education, Self Care, Joint mobilization, Joint manipulation, Stair training, Orthotic/Fit training,  DME instructions, Aquatic Therapy, Dry Needling, Electrical stimulation, Cryotherapy, Moist heat, Taping, Ultrasound, Ionotophoresis 4mg /ml Dexamethasone, Manual therapy,  Vasopneumatic device, Traction, Spinal manipulation, Spinal mobilization,Balance training, Gait training,   PLAN FOR NEXT SESSION:  discuss cleats/orthotics   Terrilee Few, PT, DPT 10:43 AM  10/07/23

## 2023-10-12 ENCOUNTER — Ambulatory Visit: Admitting: Sports Medicine

## 2023-10-13 ENCOUNTER — Encounter: Payer: Self-pay | Admitting: Physical Therapy

## 2023-10-13 ENCOUNTER — Ambulatory Visit: Admitting: Physical Therapy

## 2023-10-13 DIAGNOSIS — M2142 Flat foot [pes planus] (acquired), left foot: Secondary | ICD-10-CM

## 2023-10-13 DIAGNOSIS — M25571 Pain in right ankle and joints of right foot: Secondary | ICD-10-CM | POA: Diagnosis not present

## 2023-10-13 DIAGNOSIS — M2141 Flat foot [pes planus] (acquired), right foot: Secondary | ICD-10-CM | POA: Diagnosis not present

## 2023-10-13 NOTE — Therapy (Signed)
 OUTPATIENT PHYSICAL THERAPY LOWER EXTREMITY TREATMENT   Patient Name: Frederick Ramirez MRN: [id number] DOB:[birth year], 23 y.o., male  Today's Date: 10/13/2023  END OF SESSION:  PT End of Session - 10/13/23 1017     Visit Number 4    Number of Visits 16    Date for PT Re-Evaluation 11/13/23    Authorization Type [facility]    PT Start Time 1018    PT Stop Time 1056    PT Time Calculation (min) 38 min    Activity Tolerance Patient tolerated treatment well    Behavior During Therapy WFL for tasks assessed/performed              Past Medical History:  Diagnosis Date   Allergic rhinitis    GERD (gastroesophageal reflux disease)    Pes planus of both feet    Past Surgical History:  Procedure Laterality Date   ADENOIDECTOMY     approx 2010   Patient Active Problem List   Diagnosis Date Noted   Pain in joint, ankle and foot, right 08/07/2023   Allergic rhinitis 01/07/2023     PCP: McGowen, Phillip`  REFERRING PROVIDER:  Annemarie Kil  REFERRING DIAG: Pain in r ankle   THERAPY DIAG:  Pain in right ankle and joints of right foot  Pes planus of both feet  Rationale for Evaluation and Treatment: Rehabilitation  ONSET DATE:    SUBJECTIVE:   SUBJECTIVE STATEMENT: Pt states doing ok, some increased pain at end of days with working.   Eval: Pt states ongoing pain in R foot/ankle. States it has been painful for a long time, but has changed a bit. He used to have more pain in arch from flat foot, but in last year or so has been in lateral ankle. States increased pain since about February where he did more walking for several days. He has been wearing boot for about 6 weeks, today is end of 6 weeks, and he is wearing lace up ankle brace for driving purposes. He goes to school in [place] but will be back in town this summer starting in 2 weeks. He does lots of walking at college which he finds to be very painful. He is not currently playing sports, but would like  to start playing Lacrosse again next year- club team. He is currently wearing [employer] shoes, also has work boots. He has custom orthotics that are comfortable at this time.    PERTINENT HISTORY: N/A  PAIN:  Are you having pain? Yes: NPRS scale: 4 /10 Pain location: lateral ankle Pain description: sore,  Aggravating factors: standing, walking  Relieving factors: rest  PRECAUTIONS: None  WEIGHT BEARING RESTRICTIONS: No  FALLS:  Has patient fallen in last 6 months? No  PLOF: Independent  PATIENT GOALS:  Decreased pain In ankle.   NEXT MD VISIT:   OBJECTIVE:   DIAGNOSTIC FINDINGS:   PATIENT SURVEYS:    COGNITION: Overall cognitive status: Within functional limits for tasks assessed     SENSATION: WFL  EDEMA:   POSTURE:  bare feet:  Pes planus bil R>L.  R with severe over pronation and navicular drop.  Shoes on: moderate/significant improvement of alignment for R foot/ankle with orthotic and sneakers on.   PALPATION: No pain to palpate lateral ankle on R.   LOWER EXTREMITY ROM: Hips/knees: WFL L ankle: WFL R ankle: limited arom for INV  LOWER EXTREMITY MMT:  MMT Left eval Right  eval  Hip flexion    Hip extension  Hip abduction    Hip adduction    Hip internal rotation    Hip external rotation    Knee flexion    Knee extension    Ankle dorsiflexion    Ankle plantarflexion    Ankle inversion    Ankle eversion     (Blank rows = not tested)  LOWER EXTREMITY SPECIAL TESTS:    FUNCTIONAL TESTS:  SLS:  significant sway on R, moderate on L.   GAIT:   TODAY'S TREATMENT:                                                                                                                              DATE:   10/13/2023 Therapeutic Exercise: Aerobic: bike L2 x 6 min  Supine: bridging  with GTB 2 x 10  ;  arom inv x 15;   arom pf/df x 15;  prom for L inv rom.  S/L: hip abd x 10 bil;  Seated:    INV RTB x 20;  Standing: heel raises 2 x 10, heel raise  (V) x 15;    Stretches:  Neuromuscular Re-education: Manual Therapy: Therapeutic Activity: lateral squat walk with GTB x 6 for hip strength;  Hip abd 2 x 10 bil;  squats 2 x 10;  Self Care:   PATIENT EDUCATION:  Education details: updated and reviewed HEP.  Person educated: Patient Education method: Explanation, Demonstration, Tactile cues, Verbal cues, and Handouts Education comprehension: verbalized understanding, returned demonstration, verbal cues required, tactile cues required, and needs further education   HOME EXERCISE PROGRAM: Access Code: [email or web address] URL: [email or web address] Date: 09/20/2023 Prepared by: Terrilee Few  Exercises - Supine Bridge  - 1 x daily - 4-5 x weekly - 2 sets - 10 reps - Seated Heel Raise  - 1 x daily - 4-5 x weekly - 2 sets - 10 reps - Supine Ankle Inversion AROM  - 1 x daily - 4-5 x weekly - 2 sets - 10 reps  ASSESSMENT:  CLINICAL IMPRESSION: Pt with improving ability for heel raises, still fatiguing, but with improving mechanics. He has difficulty at start of session with moving foot into inversion motion, improved after it loosens up a bit. Improving ability for activation for INV with t-band. He is challenged with hip strength and stability today, will benefit from continued work on this. Add push off/weight shifts for gait next visit.   Eval: Patient presents with primary complaint of  pain in R lateral ankle. He has significant over pronation on R which is likely cause of pain. He has lack of effective HEP for his Ongoing pain. He is getting good positional alignment from current orthotics and shoes, but would benefit from updated footwear, recommendations given today. He has poor ability for Inversion ROM and poor strength, and will benefit from overall strength and stability for bil foot/ankle. He will start to transition out of boot, discussed weaning out slowly as pain allows, pt states understanding.  Pt  with decreased ability  for full functional activities, walking, running and exercise.  Pt will  benefit from skilled PT to improve deficits and pain and to return to PLOF.   OBJECTIVE IMPAIRMENTS: Abnormal gait, decreased activity tolerance, decreased ROM, decreased strength, impaired flexibility, improper body mechanics, and pain.   ACTIVITY LIMITATIONS: standing, squatting, stairs, and locomotion level  PARTICIPATION LIMITATIONS: driving, shopping, community activity, occupation, and yard work  PERSONAL FACTORS: Past/current experiences and Time since onset of injury/illness/exacerbation are also affecting patient's functional outcome.   REHAB POTENTIAL: Good  CLINICAL DECISION MAKING: Stable/uncomplicated  EVALUATION COMPLEXITY: Low   GOALS: Goals reviewed with patient? Yes  SHORT TERM GOALS: Target date: 10/02/23  Pt to be independent with initial HEP  Goal status: INITIAL  2.  Pt to demo ability for active inversion ROM  Goal status: INITIAL   LONG TERM GOALS: Target date: 11/13/23  Pt to be independent with final HEP  Goal status: INITIAL  2.  Pt to demo optimal mechanics with standing heel raise  Goal status: INITIAL  3.  Pt to demo max strength and stability for bil foot/ankle, to improve gait pattern  Goal status: INITIAL  4.  Pt to be compliant with footwear and/or orthotics as appropriate for his foot type , for optimal alignment and pressure relief.   Goal status: INITIAL  5.  Pt to report decreased pain levels in R foot  to 0-2/10 to improve ability for exercise and running.   Goal status: INITIAL   PLAN:  PT FREQUENCY: 1-2x/week  PT DURATION: 8 weeks  PLANNED INTERVENTIONS: Therapeutic exercises, Therapeutic activity, Neuromuscular re-education, Patient/Family education, Self Care, Joint mobilization, Joint manipulation, Stair training, Orthotic/Fit training, DME instructions, Aquatic Therapy, Dry Needling, Electrical stimulation, Cryotherapy, Moist heat, Taping,  Ultrasound, Ionotophoresis 4mg /ml Dexamethasone, Manual therapy,  Vasopneumatic device, Traction, Spinal manipulation, Spinal mobilization,Balance training, Gait training,   PLAN FOR NEXT SESSION:  discuss cleats/orthotics, turf shoe    Terrilee Few, PT, DPT 10:21 AM  10/13/23

## 2023-10-15 ENCOUNTER — Encounter: Payer: Self-pay | Admitting: Physical Therapy

## 2023-10-15 ENCOUNTER — Ambulatory Visit: Admitting: Physical Therapy

## 2023-10-15 DIAGNOSIS — M2141 Flat foot [pes planus] (acquired), right foot: Secondary | ICD-10-CM

## 2023-10-15 DIAGNOSIS — M25571 Pain in right ankle and joints of right foot: Secondary | ICD-10-CM

## 2023-10-15 DIAGNOSIS — M2142 Flat foot [pes planus] (acquired), left foot: Secondary | ICD-10-CM | POA: Diagnosis not present

## 2023-10-15 NOTE — Therapy (Signed)
 OUTPATIENT PHYSICAL THERAPY LOWER EXTREMITY TREATMENT   Patient Name: Frederick Ramirez MRN: [id number] DOB:01-05-2001, 23 y.o., male  Today's Date: 10/15/2023  END OF SESSION:  PT End of Session - 10/15/23 0936     Visit Number 5    Number of Visits 16    Date for PT Re-Evaluation 11/13/23    Authorization Type [facility]    PT Start Time 0936    PT Stop Time 1015    PT Time Calculation (min) 39 min    Activity Tolerance Patient tolerated treatment well    Behavior During Therapy WFL for tasks assessed/performed              Past Medical History:  Diagnosis Date   Allergic rhinitis    GERD (gastroesophageal reflux disease)    Pes planus of both feet    Past Surgical History:  Procedure Laterality Date   ADENOIDECTOMY     approx 2010   Patient Active Problem List   Diagnosis Date Noted   Pain in joint, ankle and foot, right 08/07/2023   Allergic rhinitis 01/07/2023     PCP: McGowen, Phillip`  REFERRING PROVIDER:  Annemarie Kil  REFERRING DIAG: Pain in r ankle   THERAPY DIAG:  Pain in right ankle and joints of right foot  Pes planus of both feet  Rationale for Evaluation and Treatment: Rehabilitation  ONSET DATE:    SUBJECTIVE:   SUBJECTIVE STATEMENT: Pt states doing ok, some stiffness in foot at end of the day with longer standing/working. But minimal pain.   Eval: Pt states ongoing pain in R foot/ankle. States it has been painful for a long time, but has changed a bit. He used to have more pain in arch from flat foot, but in last year or so has been in lateral ankle. States increased pain since about February where he did more walking for several days. He has been wearing boot for about 6 weeks, today is end of 6 weeks, and he is wearing lace up ankle brace for driving purposes. He goes to school in [place] but will be back in town this summer starting in 2 weeks. He does lots of walking at college which he finds to be very painful. 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 He is currently wearing [employer] shoes, also has work boots. He has custom orthotics that are comfortable at this time.    PERTINENT HISTORY: N/A  PAIN:  Are you having pain? Yes: NPRS scale: 4 /10 Pain location: lateral ankle Pain description: sore,  Aggravating factors: standing, walking  Relieving factors: rest  PRECAUTIONS: None  WEIGHT BEARING RESTRICTIONS: No  FALLS:  Has patient fallen in last 6 months? No  PLOF: Independent  PATIENT GOALS:  Decreased pain In ankle.   NEXT MD VISIT:   OBJECTIVE:   DIAGNOSTIC FINDINGS:   PATIENT SURVEYS:    COGNITION: Overall cognitive status: Within functional limits for tasks assessed     SENSATION: WFL  EDEMA:   POSTURE:  bare feet:  Pes planus bil R>L.  R with severe over pronation and navicular drop.  Shoes on: moderate/significant improvement of alignment for R foot/ankle with orthotic and sneakers on.   PALPATION: No pain to palpate lateral ankle on R.   LOWER EXTREMITY ROM: Hips/knees: WFL L ankle: WFL R ankle: limited arom for INV  LOWER EXTREMITY MMT:  MMT Left eval Right  eval  Hip flexion  Hip extension    Hip abduction    Hip adduction    Hip internal rotation    Hip external rotation    Knee flexion    Knee extension    Ankle dorsiflexion    Ankle plantarflexion    Ankle inversion    Ankle eversion     (Blank rows = not tested)  LOWER EXTREMITY SPECIAL TESTS:    FUNCTIONAL TESTS:  SLS:  significant sway on R, moderate on L.   GAIT:   TODAY'S TREATMENT:                                                                                                                              DATE:   10/15/2023 Therapeutic Exercise: Aerobic: bike L2 x 6 min  Supine:   arom inv x 15;   arom pf/df x 15;  prom for L inv rom.  S/L: Seated:    INV RTB x 20;  Standing: heel raises x 15, heel raise (V) 2x  10;    Stretches:  Neuromuscular Re-education: Manual Therapy: Therapeutic Activity: lateral squat walk with GTB x 6 for hip strength;  Hip abd 2 x 10 bil;  squats  25 lb x 8,  elevated heels 2 x 5; Staggered stance weight shift for push off on back foot x 20 bil;   Self Care:   PATIENT EDUCATION:  Education details: updated and reviewed HEP.  Person educated: Patient Education method: Explanation, Demonstration, Tactile cues, Verbal cues, and Handouts Education comprehension: verbalized understanding, returned demonstration, verbal cues required, tactile cues required, and needs further education   HOME EXERCISE PROGRAM: Access Code: [email or web address] URL: [email or web address] Date: 09/20/2023 Prepared by: Terrilee Few  Exercises - Supine Bridge  - 1 x daily - 4-5 x weekly - 2 sets - 10 reps - Seated Heel Raise  - 1 x daily - 4-5 x weekly - 2 sets - 10 reps - Supine Ankle Inversion AROM  - 1 x daily - 4-5 x weekly - 2 sets - 10 reps  ASSESSMENT:  CLINICAL IMPRESSION: Pt with improving ability for heel raises, still fatiguing for R side, but with improving mechanics. Noted difficulty with squat with positioning of R foot, cueing for decreasing pressure on inside of foot and into toes. Pt to benefit from continued strength for foot as tolerated.   Eval: Patient presents with primary complaint of  pain in R lateral ankle. He has significant over pronation on R which is likely cause of pain. He has lack of effective HEP for his Ongoing pain. He is getting good positional alignment from current orthotics and shoes, but would benefit from updated footwear, recommendations given today. He has poor ability for Inversion ROM and poor strength, and will benefit from overall strength and stability for bil foot/ankle. He will start to transition out of boot, discussed weaning out slowly as pain allows, pt states understanding.  Pt with decreased ability for full functional activities,  walking, running and exercise.  Pt will  benefit from skilled PT to improve deficits and pain and to return to PLOF.   OBJECTIVE IMPAIRMENTS: Abnormal gait, decreased activity tolerance, decreased ROM, decreased strength, impaired flexibility, improper body mechanics, and pain.   ACTIVITY LIMITATIONS: standing, squatting, stairs, and locomotion level  PARTICIPATION LIMITATIONS: driving, shopping, community activity, occupation, and yard work  PERSONAL FACTORS: Past/current experiences and Time since onset of injury/illness/exacerbation are also affecting patient's functional outcome.   REHAB POTENTIAL: Good  CLINICAL DECISION MAKING: Stable/uncomplicated  EVALUATION COMPLEXITY: Low   GOALS: Goals reviewed with patient? Yes  SHORT TERM GOALS: Target date: 10/02/23  Pt to be independent with initial HEP  Goal status: INITIAL  2.  Pt to demo ability for active inversion ROM  Goal status: INITIAL   LONG TERM GOALS: Target date: 11/13/23  Pt to be independent with final HEP  Goal status: INITIAL  2.  Pt to demo optimal mechanics with standing heel raise  Goal status: INITIAL  3.  Pt to demo max strength and stability for bil foot/ankle, to improve gait pattern  Goal status: INITIAL  4.  Pt to be compliant with footwear and/or orthotics as appropriate for his foot type , for optimal alignment and pressure relief.   Goal status: INITIAL  5.  Pt to report decreased pain levels in R foot  to 0-2/10 to improve ability for exercise and running.   Goal status: INITIAL   PLAN:  PT FREQUENCY: 1-2x/week  PT DURATION: 8 weeks  PLANNED INTERVENTIONS: Therapeutic exercises, Therapeutic activity, Neuromuscular re-education, Patient/Family education, Self Care, Joint mobilization, Joint manipulation, Stair training, Orthotic/Fit training, DME instructions, Aquatic Therapy, Dry Needling, Electrical stimulation, Cryotherapy, Moist heat, Taping, Ultrasound, Ionotophoresis 4mg /ml  Dexamethasone, Manual therapy,  Vasopneumatic device, Traction, Spinal manipulation, Spinal mobilization,Balance training, Gait training,   PLAN FOR NEXT SESSION:  discuss cleats/orthotics, turf shoe    Terrilee Few, PT, DPT 12:12 PM  10/15/23

## 2023-10-21 ENCOUNTER — Encounter: Payer: Self-pay | Admitting: Physical Therapy

## 2023-10-21 ENCOUNTER — Ambulatory Visit: Admitting: Physical Therapy

## 2023-10-21 DIAGNOSIS — M2142 Flat foot [pes planus] (acquired), left foot: Secondary | ICD-10-CM

## 2023-10-21 DIAGNOSIS — M25571 Pain in right ankle and joints of right foot: Secondary | ICD-10-CM

## 2023-10-21 DIAGNOSIS — M2141 Flat foot [pes planus] (acquired), right foot: Secondary | ICD-10-CM | POA: Diagnosis not present

## 2023-10-21 NOTE — Therapy (Signed)
 OUTPATIENT PHYSICAL THERAPY LOWER EXTREMITY TREATMENT   Patient Name: Frederick Ramirez MRN: [id number] DOB:18-Sep-2000, 23 y.o., male  Today's Date: 10/21/2023  END OF SESSION:  PT End of Session - 10/21/23 0757     Visit Number 6    Number of Visits 16    Date for PT Re-Evaluation 11/13/23    Authorization Type [facility]    PT Start Time 0800    PT Stop Time 0845    PT Time Calculation (min) 45 min    Activity Tolerance Patient tolerated treatment well    Behavior During Therapy WFL for tasks assessed/performed              Past Medical History:  Diagnosis Date   Allergic rhinitis    GERD (gastroesophageal reflux disease)    Pes planus of both feet    Past Surgical History:  Procedure Laterality Date   ADENOIDECTOMY     approx 2010   Patient Active Problem List   Diagnosis Date Noted   Pain in joint, ankle and foot, right 08/07/2023   Allergic rhinitis 01/07/2023     PCP: McGowen, Phillip`  REFERRING PROVIDER:  Annemarie Kil  REFERRING DIAG: Pain in r ankle   THERAPY DIAG:  Pain in right ankle and joints of right foot  Pes planus of both feet  Rationale for Evaluation and Treatment: Rehabilitation  ONSET DATE:    SUBJECTIVE:   SUBJECTIVE STATEMENT: Pt states doing ok, reporting less pain overall.   Eval: Pt states ongoing pain in R foot/ankle. States it has been painful for a long time, but has changed a bit. He used to have more pain in arch from flat foot, but in last year or so has been in lateral ankle. States increased pain since about February where he did more walking for several days. He has been wearing boot for about 6 weeks, today is end of 6 weeks, and he is wearing lace up ankle brace for driving purposes. He goes to school in [place] but will be back in town this summer starting in 2 weeks. He does lots of walking at college which he finds to be very painful. 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 He is currently wearing [employer] shoes, also has work boots. He has custom orthotics that are comfortable at this time.    PERTINENT HISTORY: N/A  PAIN:  Are you having pain? Yes: NPRS scale: 4 /10 Pain location: lateral ankle Pain description: sore,  Aggravating factors: standing, walking  Relieving factors: rest  PRECAUTIONS: None  WEIGHT BEARING RESTRICTIONS: No  FALLS:  Has patient fallen in last 6 months? No  PLOF: Independent  PATIENT GOALS:  Decreased pain In ankle.   NEXT MD VISIT:   OBJECTIVE:   DIAGNOSTIC FINDINGS:   PATIENT SURVEYS:    COGNITION: Overall cognitive status: Within functional limits for tasks assessed     SENSATION: WFL  EDEMA:   POSTURE:  bare feet:  Pes planus bil R>L.  R with severe over pronation and navicular drop.  Shoes on: moderate/significant improvement of alignment for R foot/ankle with orthotic and sneakers on.   PALPATION: No pain to palpate lateral ankle on R.   LOWER EXTREMITY ROM: Hips/knees: WFL L ankle: WFL R ankle: limited arom for INV  LOWER EXTREMITY MMT:  MMT Left eval Right  eval  Hip flexion    Hip extension    Hip abduction  Hip adduction    Hip internal rotation    Hip external rotation    Knee flexion    Knee extension    Ankle dorsiflexion    Ankle plantarflexion    Ankle inversion    Ankle eversion     (Blank rows = not tested)  LOWER EXTREMITY SPECIAL TESTS:    FUNCTIONAL TESTS:  SLS:  significant sway on R, moderate on L.   GAIT:   TODAY'S TREATMENT:                                                                                                                              DATE:   10/21/2023 Therapeutic Exercise: Aerobic: bike L2 x 6 min  Supine:   arom inv x 15;   S/L: Seated:    INV RTB x 20;  Standing: heel raises x 15, heel raise (V) 2x 10;   facing counter- heel raise, with SL eccentric lowering 3 x 5 bil;  Stretches:   Neuromuscular Re-education: SLS  on L and R  Manual Therapy: Therapeutic Activity:    Step ups with march 2 x 10 bil;  lateral step up x 10 bil;  Self Care:   PATIENT EDUCATION:  Education details: updated and reviewed HEP.  Person educated: Patient Education method: Explanation, Demonstration, Tactile cues, Verbal cues, and Handouts Education comprehension: verbalized understanding, returned demonstration, verbal cues required, tactile cues required, and needs further education   HOME EXERCISE PROGRAM: Access Code: [email or web address] URL: [email or web address] Date: 09/20/2023 Prepared by: Terrilee Few  Exercises - Supine Bridge  - 1 x daily - 4-5 x weekly - 2 sets - 10 reps - Seated Heel Raise  - 1 x daily - 4-5 x weekly - 2 sets - 10 reps - Supine Ankle Inversion AROM  - 1 x daily - 4-5 x weekly - 2 sets - 10 reps  ASSESSMENT:  CLINICAL IMPRESSION: Pt with improving ability for heel raises. He does have notes weakness in whole R leg, with weakness and instability in hips, quad and gastroc with most activities today. Very challenged with trials for SLS. Will benefit from continued care.   Eval: Patient presents with primary complaint of  pain in R lateral ankle. He has significant over pronation on R which is likely cause of pain. He has lack of effective HEP for his Ongoing pain. He is getting good positional alignment from current orthotics and shoes, but would benefit from updated footwear, recommendations given today. He has poor ability for Inversion ROM and poor strength, and will benefit from overall strength and stability for bil foot/ankle. He will start to transition out of boot, discussed weaning out slowly as pain allows, pt states understanding.  Pt with decreased ability for full functional activities, walking, running and exercise.  Pt will  benefit from skilled PT to improve deficits and pain and to return to PLOF.   OBJECTIVE IMPAIRMENTS: Abnormal gait,  decreased activity tolerance, decreased ROM,  decreased strength, impaired flexibility, improper body mechanics, and pain.   ACTIVITY LIMITATIONS: standing, squatting, stairs, and locomotion level  PARTICIPATION LIMITATIONS: driving, shopping, community activity, occupation, and yard work  PERSONAL FACTORS: Past/current experiences and Time since onset of injury/illness/exacerbation are also affecting patient's functional outcome.   REHAB POTENTIAL: Good  CLINICAL DECISION MAKING: Stable/uncomplicated  EVALUATION COMPLEXITY: Low   GOALS: Goals reviewed with patient? Yes  SHORT TERM GOALS: Target date: 10/02/23  Pt to be independent with initial HEP  Goal status: MET  2.  Pt to demo ability for active inversion ROM  Goal status: MET   LONG TERM GOALS: Target date: 11/13/23  Pt to be independent with final HEP  Goal status: INITIAL  2.  Pt to demo optimal mechanics with standing heel raise  Goal status: INITIAL  3.  Pt to demo max strength and stability for bil foot/ankle, to improve gait pattern  Goal status: INITIAL  4.  Pt to be compliant with footwear and/or orthotics as appropriate for his foot type , for optimal alignment and pressure relief.   Goal status: INITIAL  5.  Pt to report decreased pain levels in R foot  to 0-2/10 to improve ability for exercise and running.   Goal status: INITIAL   PLAN:  PT FREQUENCY: 1-2x/week  PT DURATION: 8 weeks  PLANNED INTERVENTIONS: Therapeutic exercises, Therapeutic activity, Neuromuscular re-education, Patient/Family education, Self Care, Joint mobilization, Joint manipulation, Stair training, Orthotic/Fit training, DME instructions, Aquatic Therapy, Dry Needling, Electrical stimulation, Cryotherapy, Moist heat, Taping, Ultrasound, Ionotophoresis 4mg /ml Dexamethasone, Manual therapy,  Vasopneumatic device, Traction, Spinal manipulation, Spinal mobilization,Balance training, Gait training,   PLAN FOR NEXT SESSION:   discuss cleats/orthotics, turf shoe    Terrilee Few, PT, DPT 12:55 PM  10/21/23

## 2023-10-23 ENCOUNTER — Encounter: Admitting: Physical Therapy

## 2023-10-29 ENCOUNTER — Ambulatory Visit: Admitting: Sports Medicine

## 2023-10-29 DIAGNOSIS — R292 Abnormal reflex: Secondary | ICD-10-CM

## 2023-10-29 DIAGNOSIS — M25571 Pain in right ankle and joints of right foot: Secondary | ICD-10-CM

## 2023-10-29 NOTE — Assessment & Plan Note (Addendum)
 This is a very pleasant 23 year old male, long history of right foot and ankle discomfort, known severe pes planus, he did work with the podiatrist in the past and was able to get some custom molded inserts, unfortunately these were not comfortable and did not fully correct his pes planus. He is in college, walking around school causes severe pain he localizes more laterally. At the initial exam he was unable to invert his ankle actively to neutral. He had tenderness at the cuboid, calcaneus and sinus tarsi. Using firm pressure I was able to invert his ankle to neutral, he did have some clonus when attempting to move his foot immediately raising the concern for some muscle spasticity. 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 We added x-rays, MRI, formal PT. 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 PT has been very helpful, he is 50% better and continuing to improve. His range of motion is significantly better, near full. He does still have approximately 4-6 beats of clonus with rapid inversion of his foot and ankle, his Achilles, patellar tendon reflexes are quite brisk. He also has a minimally positive Hoffmann sign bilaterally and brisk reflexes to brachioradialis, biceps, and triceps. For this reason I do think it is reasonable to scan his CNS to ensure were not dealing with an upper motor neuron lesion, I would also like nerve conduction and EMG or at least a consultation with neurology. I would also like him to get custom molded orthotics.

## 2023-10-29 NOTE — Assessment & Plan Note (Signed)
 Frederick Ramirez is a pleasant and healthy 23 year old male currently in college. No developmental delays, he was full-term with no complications through birth or childhood. Neurologic exam is as follows: Cranial nerves II through XII are intact, motor, sensory, coordinative functions are intact. He does still have approximately 4-6 beats of clonus with rapid inversion of his right foot and ankle, his Achilles, patellar tendon reflexes are quite brisk. He also has a minimally positive Hoffmann sign bilaterally and brisk reflexes to brachioradialis, biceps, and triceps. For this reason I do think it is reasonable to scan his CNS to ensure were not dealing with an upper motor neuron lesion, I would also like nerve conduction and EMG or at least a consultation with neurology.

## 2023-10-29 NOTE — Addendum Note (Signed)
 Addended by: Gean Keels on: 10/29/2023 09:03 AM   Modules accepted: Orders

## 2023-10-29 NOTE — Progress Notes (Signed)
    Procedures performed today:    None.  Independent interpretation of notes and tests performed by another provider:   None.  Brief History, Exam, Impression, and Recommendations:    Pain in joint, ankle and foot, right This is a very pleasant 23 year old male, long history of right foot and ankle discomfort, known severe pes planus, he did work with the podiatrist in the past and was able to get some custom molded inserts, unfortunately these were not comfortable and did not fully correct his pes planus. He is in college, walking around school causes severe pain he localizes more laterally. At the initial exam he was unable to invert his ankle actively to neutral. He had tenderness at the cuboid, calcaneus and sinus tarsi. Using firm pressure I was able to invert his ankle to neutral, he did have some clonus when attempting to move his foot immediately raising the concern for some muscle spasticity. 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 We added x-rays, MRI, formal PT. 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 PT has been very helpful, he is 50% better and continuing to improve. His range of motion is significantly better, near full. He does still have approximately 4-6 beats of clonus with rapid inversion of his foot and ankle, his Achilles, patellar tendon reflexes are quite brisk. He also has a minimally positive Hoffmann sign bilaterally and brisk reflexes to brachioradialis, biceps, and triceps. For this reason I do think it is reasonable to scan his CNS to ensure were not dealing with an upper motor neuron lesion, I would also like nerve conduction and EMG or at least a consultation with neurology. I would also like him to get custom molded orthotics.  Abnormal reflexes Bryceson is a pleasant and healthy 24 year old male currently in college. No developmental delays, he was full-term with no complications through birth or childhood. Neurologic exam is as  follows: Cranial nerves II through XII are intact, motor, sensory, coordinative functions are intact. He does still have approximately 4-6 beats of clonus with rapid inversion of his right foot and ankle, his Achilles, patellar tendon reflexes are quite brisk. He also has a minimally positive Hoffmann sign bilaterally and brisk reflexes to brachioradialis, biceps, and triceps. For this reason I do think it is reasonable to scan his CNS to ensure were not dealing with an upper motor neuron lesion, I would also like nerve conduction and EMG or at least a consultation with neurology.    ____________________________________________ Joselyn Nicely. Sandy Crumb, M.D., ABFM., CAQSM., AME. Primary Care and Sports Medicine [facility]  Adjunct Professor of [facility] Medicine  [employer]  Restaurant manager, fast food

## 2023-10-30 ENCOUNTER — Encounter: Payer: Self-pay | Admitting: Physical Therapy

## 2023-10-30 ENCOUNTER — Encounter: Payer: Self-pay | Admitting: Sports Medicine

## 2023-10-30 ENCOUNTER — Ambulatory Visit: Admitting: Physical Therapy

## 2023-10-30 ENCOUNTER — Ambulatory Visit: Admitting: Family Medicine

## 2023-10-30 VITALS — BP 112/78 | Ht 74.0 in | Wt 220.0 lb

## 2023-10-30 DIAGNOSIS — M2141 Flat foot [pes planus] (acquired), right foot: Secondary | ICD-10-CM

## 2023-10-30 DIAGNOSIS — M2142 Flat foot [pes planus] (acquired), left foot: Secondary | ICD-10-CM

## 2023-10-30 DIAGNOSIS — M25571 Pain in right ankle and joints of right foot: Secondary | ICD-10-CM

## 2023-10-30 NOTE — Progress Notes (Signed)
   PCP: Shelvia Dick, MD  SUBJECTIVE:   HPI:  Patient is a 23 y.o. male here with chief complaint of bilateral foot and ankle pain, worse on the right.  He has had extensive workup with Dr. Elva Hamburger who sent him over for custom orthotics. He has a pair of custom orthotics currently that he doesn't find to be too beneficial. He also is pending brain, cervical, thoracic, and lumbar spine MRI net week and plans to follow-up with Dr. Elva Hamburger to review those afterwards.  Pertinent ROS were reviewed with the patient and found to be negative unless otherwise specified above in HPI.   PERTINENT  PMH / PSH / FH / SH:  Past Medical, Surgical, Social, and Family History Reviewed & Updated in the EMR.  Pertinent findings include:  Pes Planus  Past Surgical History:  Procedure Laterality Date   ADENOIDECTOMY     approx 2010    No Known Allergies  OBJECTIVE:  BP 112/78   Ht 6\' 2"  (1.88 m)   Wt 220 lb (99.8 kg)   BMI 28.25 kg/m   PHYSICAL EXAM:  GEN: Alert and Oriented, NAD, comfortable in exam room RESP: Unlabored respirations, symmetric chest rise PSY: normal mood, congruent affect   Bilateral Foot and Ankle Exam: Right foot/ankle moderately erythematous diffusely with mild ankle swelling. Left without swelling or erythema. Significant pes planus bilaterally with navicular drop, R>L. Transverse arches fairly well preserved. Moderate pronation at rest and dynamically with gait analysis, R>L. Hindfoot valgus bilaterally, R>L. Normal posterior tibialis firing with toe up.  Assessment & Plan  1. Pain in joint, ankle and foot, right (Primary) 2. Pes planus of both feet  23 year old with long standing history of right foot and ankle pain with multiple previous injuries. We made him custom orthotics today to try to provide improved arch support and correction of his hindfoot valgus/pronation as well. He will follow-up with Dr. Elva Hamburger for further management, however can return for adjustment of his  orthotics as needed.  Patient was fitted for a: standard, cushioned, semi-rigid orthotic. The orthotic was heated and afterward the patient stood on the orthotic blank positioned on the orthotic stand. The patient was positioned in subtalar neutral position and 10 degrees of ankle dorsiflexion in a weight bearing stance on the heated orthotic blank. After completion of molding, a stable base was applied to the orthotic blank. The blank was ground to a stable position for weight bearing. Size: 14 Base: Fit&Run Posting: None Additional orthotic padding: Large scaphoid pads bilaterally and medial heel wedges bilaterally  Ambulated with these and noted comfort. His left foot pronation was corrected and normal gait, right foot with improved pronation and hindfoot valgus, however still mildly present despite the corrections above.   Lin Rend, MD PGY-4, Sports Medicine Fellow [facility]ally [facility]

## 2023-10-30 NOTE — Therapy (Signed)
 OUTPATIENT PHYSICAL THERAPY LOWER EXTREMITY TREATMENT   Patient Name: Frederick Ramirez MRN: [id number] DOB:[birth year], 23 y.o., male  Today's Date: 10/21/2023  END OF SESSION:  PT End of Session - 10/30/23 0853     Visit Number 7    Number of Visits 16    Date for PT Re-Evaluation 11/13/23    Authorization Type [facility]    PT Start Time 0847    PT Stop Time 0930    PT Time Calculation (min) 43 min    Activity Tolerance Patient tolerated treatment well    Behavior During Therapy WFL for tasks assessed/performed              Past Medical History:  Diagnosis Date   Allergic rhinitis    GERD (gastroesophageal reflux disease)    Pes planus of both feet    Past Surgical History:  Procedure Laterality Date   ADENOIDECTOMY     approx 2010   Patient Active Problem List   Diagnosis Date Noted   Abnormal reflexes 10/29/2023   Pain in joint, ankle and foot, right 08/07/2023   Allergic rhinitis 01/07/2023     PCP: McGowen, Phillip`  REFERRING PROVIDER:  Annemarie Kil  REFERRING DIAG: Pain in r ankle   THERAPY DIAG:  Pain in right ankle and joints of right foot  Pes planus of both feet  Rationale for Evaluation and Treatment: Rehabilitation  ONSET DATE:    SUBJECTIVE:   SUBJECTIVE STATEMENT: Pt states doing ok, reporting less pain overall.   Eval: Pt states ongoing pain in R foot/ankle. States it has been painful for a long time, but has changed a bit. He used to have more pain in arch from flat foot, but in last year or so has been in lateral ankle. States increased pain since about February where he did more walking for several days. He has been wearing boot for about 6 weeks, today is end of 6 weeks, and he is wearing lace up ankle brace for driving purposes. He goes to school in [place] but will be back in town this summer starting in 2 weeks. He does lots of walking at college which he finds to be very painful. 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 He is currently wearing [employer] shoes, also has work boots. He has custom orthotics that are comfortable at this time.    PERTINENT HISTORY: N/A  PAIN:  Are you having pain? Yes: NPRS scale: 4 /10 Pain location: lateral ankle Pain description: sore,  Aggravating factors: standing, walking  Relieving factors: rest  PRECAUTIONS: None  WEIGHT BEARING RESTRICTIONS: No  FALLS:  Has patient fallen in last 6 months? No  PLOF: Independent  PATIENT GOALS:  Decreased pain In ankle.   NEXT MD VISIT:   OBJECTIVE:   DIAGNOSTIC FINDINGS:   PATIENT SURVEYS:    COGNITION: Overall cognitive status: Within functional limits for tasks assessed     SENSATION: WFL  EDEMA:   POSTURE:  bare feet:  Pes planus bil R>L.  R with severe over pronation and navicular drop.  Shoes on: moderate/significant improvement of alignment for R foot/ankle with orthotic and sneakers on.   PALPATION: No pain to palpate lateral ankle on R.   LOWER EXTREMITY ROM: Hips/knees: WFL L ankle: WFL R ankle: limited arom for INV  LOWER EXTREMITY MMT:  MMT Left eval Right  eval  Hip flexion    Hip extension  Hip abduction    Hip adduction    Hip internal rotation    Hip external rotation    Knee flexion    Knee extension    Ankle dorsiflexion    Ankle plantarflexion    Ankle inversion    Ankle eversion     (Blank rows = not tested)  LOWER EXTREMITY SPECIAL TESTS:    FUNCTIONAL TESTS:  SLS:  significant sway on R, moderate on L.   GAIT:   TODAY'S TREATMENT:                                                                                                                              DATE:   10/30/2023 Therapeutic Exercise: Aerobic: bike L2 x 6 min  Supine:   arom inv x 15;  prom inv and pf  S/L: Seated:     Standing: heel raises x 15, heel raise (V) 2x 10;   hip abd 2 x 10 bil;  with TYB 2 x 10 bil  (difficult) Stretches:  Neuromuscular Re-education:  Manual Therapy: Therapeutic Activity:    Step ups with march 2 x 10 bil (6 in);   12 in step up  x 10 bil;   Squats 25 lb x 20;  Self Care:   PATIENT EDUCATION:  Education details: updated and reviewed HEP.  Person educated: Patient Education method: Explanation, Demonstration, Tactile cues, Verbal cues, and Handouts Education comprehension: verbalized understanding, returned demonstration, verbal cues required, tactile cues required, and needs further education   HOME EXERCISE PROGRAM: Access Code: [email or web address] URL: [email or web address] Date: 09/20/2023 Prepared by: Terrilee Few  Exercises - Supine Bridge  - 1 x daily - 4-5 x weekly - 2 sets - 10 reps - Seated Heel Raise  - 1 x daily - 4-5 x weekly - 2 sets - 10 reps - Supine Ankle Inversion AROM  - 1 x daily - 4-5 x weekly - 2 sets - 10 reps  ASSESSMENT:  CLINICAL IMPRESSION: Pt with improving ability for LE strengthening. He is challenged with single leg/hip stability with step ups, and LE stability with squats. He will benefit from continued strengthening. He does have instances of R foot getting stiff in Everted position, taking a few minutes to loosen up to be able to perform inversion motion. Recommended he continue to activate inversion a few times throughout day. We also discussed optimal footwear for longer standing, recommendations given today. He is having new orthotics made today as well. Discussed importance of foot position/alignment for pain management.   Eval: Patient presents with primary complaint of  pain in R lateral ankle. He has significant over pronation on R which is likely cause of pain. He has lack of effective HEP for his Ongoing pain. He is getting good positional alignment from current orthotics and shoes, but would benefit from updated footwear, recommendations given today. He has poor ability for Inversion ROM and poor strength, and will  benefit from overall strength  and stability for bil foot/ankle. He will start to transition out of boot, discussed weaning out slowly as pain allows, pt states understanding.  Pt with decreased ability for full functional activities, walking, running and exercise.  Pt will  benefit from skilled PT to improve deficits and pain and to return to PLOF.   OBJECTIVE IMPAIRMENTS: Abnormal gait, decreased activity tolerance, decreased ROM, decreased strength, impaired flexibility, improper body mechanics, and pain.   ACTIVITY LIMITATIONS: standing, squatting, stairs, and locomotion level  PARTICIPATION LIMITATIONS: driving, shopping, community activity, occupation, and yard work  PERSONAL FACTORS: Past/current experiences and Time since onset of injury/illness/exacerbation are also affecting patient's functional outcome.   REHAB POTENTIAL: Good  CLINICAL DECISION MAKING: Stable/uncomplicated  EVALUATION COMPLEXITY: Low   GOALS: Goals reviewed with patient? Yes  SHORT TERM GOALS: Target date: 10/02/23  Pt to be independent with initial HEP  Goal status: MET  2.  Pt to demo ability for active inversion ROM  Goal status: MET   LONG TERM GOALS: Target date: 11/13/23  Pt to be independent with final HEP  Goal status: INITIAL  2.  Pt to demo optimal mechanics with standing heel raise  Goal status: INITIAL  3.  Pt to demo max strength and stability for bil foot/ankle, to improve gait pattern  Goal status: INITIAL  4.  Pt to be compliant with footwear and/or orthotics as appropriate for his foot type , for optimal alignment and pressure relief.   Goal status: INITIAL  5.  Pt to report decreased pain levels in R foot  to 0-2/10 to improve ability for exercise and running.   Goal status: INITIAL   PLAN:  PT FREQUENCY: 1-2x/week  PT DURATION: 8 weeks  PLANNED INTERVENTIONS: Therapeutic exercises, Therapeutic activity, Neuromuscular re-education, Patient/Family education, Self  Care, Joint mobilization, Joint manipulation, Stair training, Orthotic/Fit training, DME instructions, Aquatic Therapy, Dry Needling, Electrical stimulation, Cryotherapy, Moist heat, Taping, Ultrasound, Ionotophoresis 4mg /ml Dexamethasone, Manual therapy,  Vasopneumatic device, Traction, Spinal manipulation, Spinal mobilization,Balance training, Gait training,   PLAN FOR NEXT SESSION:     Terrilee Few, PT, DPT 8:54 AM  10/30/23

## 2023-10-30 NOTE — Progress Notes (Signed)
[facility]: Attending Note: I have examined the patient, reviewed the chart, discussed the assessment and plan with the Sports Medicine Fellow. I agree with assessment and treatment plan as detailed in the Fellow's note.  

## 2023-11-03 ENCOUNTER — Encounter: Payer: Self-pay | Admitting: Physical Therapy

## 2023-11-03 ENCOUNTER — Ambulatory Visit

## 2023-11-03 ENCOUNTER — Ambulatory Visit: Admitting: Physical Therapy

## 2023-11-03 DIAGNOSIS — R292 Abnormal reflex: Secondary | ICD-10-CM

## 2023-11-03 DIAGNOSIS — M2142 Flat foot [pes planus] (acquired), left foot: Secondary | ICD-10-CM

## 2023-11-03 DIAGNOSIS — M25571 Pain in right ankle and joints of right foot: Secondary | ICD-10-CM | POA: Diagnosis not present

## 2023-11-03 DIAGNOSIS — R258 Other abnormal involuntary movements: Secondary | ICD-10-CM | POA: Diagnosis not present

## 2023-11-03 DIAGNOSIS — M2141 Flat foot [pes planus] (acquired), right foot: Secondary | ICD-10-CM | POA: Diagnosis not present

## 2023-11-03 DIAGNOSIS — J341 Cyst and mucocele of nose and nasal sinus: Secondary | ICD-10-CM | POA: Diagnosis not present

## 2023-11-03 MED ORDER — GADOBUTROL 1 MMOL/ML IV SOLN
10.0000 mL | Freq: Once | INTRAVENOUS | Status: AC | PRN
  Administered 2023-11-03: 10 mL via INTRAVENOUS

## 2023-11-03 NOTE — Therapy (Signed)
 OUTPATIENT PHYSICAL THERAPY LOWER EXTREMITY TREATMENT   Patient Name: Frederick Ramirez MRN: [id number] DOB:06-Feb-2001, 23 y.o., male  Today's Date: 11/03/2023   END OF SESSION:  PT End of Session - 11/03/23 0856     Visit Number 8    Number of Visits 16    Date for PT Re-Evaluation 11/13/23    Authorization Type [facility]    PT Start Time 0853    PT Stop Time 0935    PT Time Calculation (min) 42 min    Activity Tolerance Patient tolerated treatment well    Behavior During Therapy WFL for tasks assessed/performed               Past Medical History:  Diagnosis Date   Allergic rhinitis    GERD (gastroesophageal reflux disease)    Pes planus of both feet    Past Surgical History:  Procedure Laterality Date   ADENOIDECTOMY     approx 2010   Patient Active Problem List   Diagnosis Date Noted   Abnormal reflexes 10/29/2023   Pain in joint, ankle and foot, right 08/07/2023   Allergic rhinitis 01/07/2023     PCP: McGowen, Phillip`  REFERRING PROVIDER:  Annemarie Kil  REFERRING DIAG: Pain in r ankle   THERAPY DIAG:  Pain in right ankle and joints of right foot  Pes planus of both feet  Rationale for Evaluation and Treatment: Rehabilitation  ONSET DATE:    SUBJECTIVE:   SUBJECTIVE STATEMENT: Pt states doing ok, reporting less pain overall. Feet still getting sore in afternoons into evenings with more standing during the day .  Eval: Pt states ongoing pain in R foot/ankle. States it has been painful for a long time, but has changed a bit. He used to have more pain in arch from flat foot, but in last year or so has been in lateral ankle. States increased pain since about February where he did more walking for several days. He has been wearing boot for about 6 weeks, today is end of 6 weeks, and he is wearing lace up ankle brace for driving purposes. He goes to school in [place] but will be back in town this summer starting in 2 weeks. He does lots of  walking at college which he finds to be very painful. 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 He is currently wearing [employer] shoes, also has work boots. He has custom orthotics that are comfortable at this time.    PERTINENT HISTORY: N/A  PAIN:  Are you having pain? Yes: NPRS scale: 4 /10 Pain location: lateral ankle Pain description: sore,  Aggravating factors: standing, walking  Relieving factors: rest  PRECAUTIONS: None  WEIGHT BEARING RESTRICTIONS: No  FALLS:  Has patient fallen in last 6 months? No  PLOF: Independent  PATIENT GOALS:  Decreased pain In ankle.   NEXT MD VISIT:   OBJECTIVE:   DIAGNOSTIC FINDINGS:   PATIENT SURVEYS:    COGNITION: Overall cognitive status: Within functional limits for tasks assessed     SENSATION: WFL  EDEMA:   POSTURE:  bare feet:  Pes planus bil R>L.  R with severe over pronation and navicular drop.  Shoes on: moderate/significant improvement of alignment for R foot/ankle with orthotic and sneakers on.   PALPATION: No pain to palpate lateral ankle on R.   LOWER EXTREMITY ROM: Hips/knees: WFL L ankle: WFL R ankle: limited arom for INV  LOWER EXTREMITY MMT:  MMT Left eval Right  eval  Hip flexion    Hip extension    Hip abduction    Hip adduction    Hip internal rotation    Hip external rotation    Knee flexion    Knee extension    Ankle dorsiflexion    Ankle plantarflexion    Ankle inversion    Ankle eversion     (Blank rows = not tested)  LOWER EXTREMITY SPECIAL TESTS:    FUNCTIONAL TESTS:  SLS:  significant sway on R, moderate on L.   GAIT:   TODAY'S TREATMENT:                                                                                                                              DATE:   11/03/2023 Therapeutic Exercise: Aerobic: bike L2 x 5 min  Supine:   PF/inv/ev GTB x 15;  arom inv x 15;   Bridging x 20;  S/L: hip abd x12  bil;  Seated:    single leg sit to stand x 12 bil (ue support when doing R);  Standing:  heel raise (V) 2x 10;   hip abd with TYB 2 x 10 bil ;  Stretches:  Neuromuscular Re-education:  Manual Therapy: Therapeutic Activity:    Self Care:   PATIENT EDUCATION:  Education details: updated and reviewed HEP.  Person educated: Patient Education method: Explanation, Demonstration, Tactile cues, Verbal cues, and Handouts Education comprehension: verbalized understanding, returned demonstration, verbal cues required, tactile cues required, and needs further education   HOME EXERCISE PROGRAM: Access Code: [email or web address] URL: [email or web address] Date: 09/20/2023 Prepared by: Terrilee Few  Exercises - Supine Bridge  - 1 x daily - 4-5 x weekly - 2 sets - 10 reps - Seated Heel Raise  - 1 x daily - 4-5 x weekly - 2 sets - 10 reps - Supine Ankle Inversion AROM  - 1 x daily - 4-5 x weekly - 2 sets - 10 reps  ASSESSMENT:  CLINICAL IMPRESSION: Continued focus on foot and LE strength/stability. Pt continues to be challenged with hip strength and foot strength. Will benefit from continued work on this. He ordered new shoes, have not come yet, will assess if he brings them in next visit.   Eval: Patient presents with primary complaint of  pain in R lateral ankle. He has significant over pronation on R which is likely cause of pain. He has lack of effective HEP for his Ongoing pain. He is getting good positional alignment from current orthotics and shoes, but would benefit from updated footwear, recommendations given today. He has poor ability for Inversion ROM and poor strength, and will benefit from overall strength and stability for bil foot/ankle. He will start to transition out of boot, discussed weaning out slowly as pain allows, pt states understanding.  Pt with decreased ability for full functional activities, walking, running and exercise.  Pt will  benefit from skilled PT to improve  deficits and pain and to return to PLOF.   OBJECTIVE IMPAIRMENTS: Abnormal gait, decreased activity tolerance, decreased ROM, decreased strength, impaired flexibility, improper body mechanics, and pain.   ACTIVITY LIMITATIONS: standing, squatting, stairs, and locomotion level  PARTICIPATION LIMITATIONS: driving, shopping, community activity, occupation, and yard work  PERSONAL FACTORS: Past/current experiences and Time since onset of injury/illness/exacerbation are also affecting patient's functional outcome.   REHAB POTENTIAL: Good  CLINICAL DECISION MAKING: Stable/uncomplicated  EVALUATION COMPLEXITY: Low   GOALS: Goals reviewed with patient? Yes  SHORT TERM GOALS: Target date: 10/02/23  Pt to be independent with initial HEP  Goal status: MET  2.  Pt to demo ability for active inversion ROM  Goal status: MET   LONG TERM GOALS: Target date: 11/13/23  Pt to be independent with final HEP  Goal status: INITIAL  2.  Pt to demo optimal mechanics with standing heel raise  Goal status: INITIAL  3.  Pt to demo max strength and stability for bil foot/ankle, to improve gait pattern  Goal status: INITIAL  4.  Pt to be compliant with footwear and/or orthotics as appropriate for his foot type , for optimal alignment and pressure relief.   Goal status: INITIAL  5.  Pt to report decreased pain levels in R foot  to 0-2/10 to improve ability for exercise and running.   Goal status: INITIAL   PLAN:  PT FREQUENCY: 1-2x/week  PT DURATION: 8 weeks  PLANNED INTERVENTIONS: Therapeutic exercises, Therapeutic activity, Neuromuscular re-education, Patient/Family education, Self Care, Joint mobilization, Joint manipulation, Stair training, Orthotic/Fit training, DME instructions, Aquatic Therapy, Dry Needling, Electrical stimulation, Cryotherapy, Moist heat, Taping, Ultrasound, Ionotophoresis 4mg /ml Dexamethasone, Manual therapy,  Vasopneumatic device, Traction, Spinal manipulation,  Spinal mobilization,Balance training, Gait training,   PLAN FOR NEXT SESSION:     Terrilee Few, PT, DPT 10:37 AM  11/03/23

## 2023-11-04 ENCOUNTER — Ambulatory Visit: Payer: Self-pay | Admitting: Sports Medicine

## 2023-11-09 ENCOUNTER — Encounter: Payer: Self-pay | Admitting: Sports Medicine

## 2023-11-09 NOTE — Telephone Encounter (Signed)
 That is longer than expected for a nerve conduction study, please call [facility] and see if they can get him in sooner for nerve conduction and EMG.

## 2023-11-09 NOTE — Telephone Encounter (Signed)
 Front desk will try to schedule him in,as well as put him on the cancellation list.

## 2023-11-09 NOTE — Telephone Encounter (Signed)
I will give them a call.

## 2023-11-10 ENCOUNTER — Ambulatory Visit: Admitting: Physical Therapy

## 2023-11-10 ENCOUNTER — Encounter: Payer: Self-pay | Admitting: Physical Therapy

## 2023-11-10 DIAGNOSIS — M2141 Flat foot [pes planus] (acquired), right foot: Secondary | ICD-10-CM | POA: Diagnosis not present

## 2023-11-10 DIAGNOSIS — M25571 Pain in right ankle and joints of right foot: Secondary | ICD-10-CM

## 2023-11-10 DIAGNOSIS — M2142 Flat foot [pes planus] (acquired), left foot: Secondary | ICD-10-CM | POA: Diagnosis not present

## 2023-11-10 NOTE — Therapy (Signed)
 OUTPATIENT PHYSICAL THERAPY LOWER EXTREMITY TREATMENT   Patient Name: Frederick Ramirez MRN: [id number] DOB:May 17, 2001, 23 y.o., male  Today's Date: 11/10/2023   END OF SESSION:  PT End of Session - 11/10/23 1348     Visit Number 9    Number of Visits 16    Date for PT Re-Evaluation 11/13/23    Authorization Type [facility]    PT Start Time 1347    PT Stop Time 1430    PT Time Calculation (min) 43 min    Activity Tolerance Patient tolerated treatment well    Behavior During Therapy WFL for tasks assessed/performed            Past Medical History:  Diagnosis Date   Allergic rhinitis    GERD (gastroesophageal reflux disease)    Pes planus of both feet    Past Surgical History:  Procedure Laterality Date   ADENOIDECTOMY     approx 2010   Patient Active Problem List   Diagnosis Date Noted   Abnormal reflexes 10/29/2023   Pain in joint, ankle and foot, right 08/07/2023   Allergic rhinitis 01/07/2023     PCP: McGowen, Phillip`  REFERRING PROVIDER:  Annemarie Kil  REFERRING DIAG: Pain in r ankle   THERAPY DIAG:  Pain in right ankle and joints of right foot  Pes planus of both feet  Rationale for Evaluation and Treatment: Rehabilitation  ONSET DATE:    SUBJECTIVE:   SUBJECTIVE STATEMENT: Pt states doing ok, reporting less pain overall. Feet still getting sore in afternoons into evenings with more standing during the day .  Eval: Pt states ongoing pain in R foot/ankle. States it has been painful for a long time, but has changed a bit. He used to have more pain in arch from flat foot, but in last year or so has been in lateral ankle. States increased pain since about February where he did more walking for several days. He has been wearing boot for about 6 weeks, today is end of 6 weeks, and he is wearing lace up ankle brace for driving purposes. He goes to school in [place] but will be back in town this summer starting in 2 weeks. He does lots of  walking at college which he finds to be very painful. 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 He is currently wearing [employer] shoes, also has work boots. He has custom orthotics that are comfortable at this time.    PERTINENT HISTORY: N/A  PAIN:  Are you having pain? Yes: NPRS scale: 4 /10 Pain location: lateral ankle Pain description: sore,  Aggravating factors: standing, walking  Relieving factors: rest  PRECAUTIONS: None  WEIGHT BEARING RESTRICTIONS: No  FALLS:  Has patient fallen in last 6 months? No  PLOF: Independent  PATIENT GOALS:  Decreased pain In ankle.   NEXT MD VISIT:   OBJECTIVE:   DIAGNOSTIC FINDINGS:   PATIENT SURVEYS:    COGNITION: Overall cognitive status: Within functional limits for tasks assessed     SENSATION: WFL  EDEMA:   POSTURE:  bare feet:  Pes planus bil R>L.  R with severe over pronation and navicular drop.  Shoes on: moderate/significant improvement of alignment for R foot/ankle with orthotic and sneakers on.   PALPATION: No pain to palpate lateral ankle on R.   LOWER EXTREMITY ROM: Hips/knees: WFL L ankle: WFL R ankle: limited arom for INV  LOWER EXTREMITY MMT:  MMT Left eval  Right  eval  Hip flexion    Hip extension    Hip abduction    Hip adduction    Hip internal rotation    Hip external rotation    Knee flexion    Knee extension    Ankle dorsiflexion    Ankle plantarflexion    Ankle inversion    Ankle eversion     (Blank rows = not tested)  LOWER EXTREMITY SPECIAL TESTS:    FUNCTIONAL TESTS:  SLS:  significant sway on R, moderate on L.   GAIT:   TODAY'S TREATMENT:                                                                                                                              DATE:   11/10/2023 Therapeutic Exercise: Aerobic:  Supine:   prom for PF/INV (2 bouts)  Pf/inv/ev GTB x 15;  arom inv x 15;   Bridging x 10;  SL  bridge x 10;  S/L:  Seated:     Standing:  heel raise (V) 2x 10;  squats 15 lb x 15;  SL squat/kickstand x 12 bil;  Lateral band walks GTB x 6;  Stretches:  Neuromuscular Re-education:  SLS 30 sec x 3 bil;  Manual Therapy: Therapeutic Activity:    Self Care:   PATIENT EDUCATION:  Education details: updated and reviewed HEP.  Person educated: Patient Education method: Explanation, Demonstration, Tactile cues, Verbal cues, and Handouts Education comprehension: verbalized understanding, returned demonstration, verbal cues required, tactile cues required, and needs further education   HOME EXERCISE PROGRAM: Access Code: [email or web address] URL: [email or web address] Date: 09/20/2023 Prepared by: Terrilee Few  Exercises - Supine Bridge  - 1 x daily - 4-5 x weekly - 2 sets - 10 reps - Seated Heel Raise  - 1 x daily - 4-5 x weekly - 2 sets - 10 reps - Supine Ankle Inversion AROM  - 1 x daily - 4-5 x weekly - 2 sets - 10 reps  ASSESSMENT:  CLINICAL IMPRESSION: Continued focus on foot and LE strength/stability. Improved step up, bridge, and squat today. He continues to have difficulty with foot getting stuck/stiff in everted position, even after a few minutes of exercise and SLS today, requiring seated arom and prom to return to normal ability for inversion and PF.   Eval: Patient presents with primary complaint of  pain in R lateral ankle. He has significant over pronation on R which is likely cause of pain. He has lack of effective HEP for his Ongoing pain. He is getting good positional alignment from current orthotics and shoes, but would benefit from updated footwear, recommendations given today. He has poor ability for Inversion ROM and poor strength, and will benefit from overall strength and stability for bil foot/ankle. He will start to transition out of boot, discussed weaning out slowly as pain allows, pt states understanding.  Pt with decreased ability for full functional  activities, walking, running and exercise.  Pt will  benefit from skilled PT to improve deficits and pain and to return to PLOF.   OBJECTIVE IMPAIRMENTS: Abnormal gait, decreased activity tolerance, decreased ROM, decreased strength, impaired flexibility, improper body mechanics, and pain.   ACTIVITY LIMITATIONS: standing, squatting, stairs, and locomotion level  PARTICIPATION LIMITATIONS: driving, shopping, community activity, occupation, and yard work  PERSONAL FACTORS: Past/current experiences and Time since onset of injury/illness/exacerbation are also affecting patient's functional outcome.   REHAB POTENTIAL: Good  CLINICAL DECISION MAKING: Stable/uncomplicated  EVALUATION COMPLEXITY: Low   GOALS: Goals reviewed with patient? Yes  SHORT TERM GOALS: Target date: 10/02/23  Pt to be independent with initial HEP  Goal status: MET  2.  Pt to demo ability for active inversion ROM  Goal status: MET   LONG TERM GOALS: Target date: 11/13/23  Pt to be independent with final HEP  Goal status: INITIAL  2.  Pt to demo optimal mechanics with standing heel raise  Goal status: INITIAL  3.  Pt to demo max strength and stability for bil foot/ankle, to improve gait pattern  Goal status: INITIAL  4.  Pt to be compliant with footwear and/or orthotics as appropriate for his foot type , for optimal alignment and pressure relief.   Goal status: INITIAL  5.  Pt to report decreased pain levels in R foot  to 0-2/10 to improve ability for exercise and running.   Goal status: INITIAL   PLAN:  PT FREQUENCY: 1-2x/week  PT DURATION: 8 weeks  PLANNED INTERVENTIONS: Therapeutic exercises, Therapeutic activity, Neuromuscular re-education, Patient/Family education, Self Care, Joint mobilization, Joint manipulation, Stair training, Orthotic/Fit training, DME instructions, Aquatic Therapy, Dry Needling, Electrical stimulation, Cryotherapy, Moist heat, Taping, Ultrasound, Ionotophoresis 4mg /ml  Dexamethasone, Manual therapy,  Vasopneumatic device, Traction, Spinal manipulation, Spinal mobilization,Balance training, Gait training,   PLAN FOR NEXT SESSION:     Terrilee Few, PT, DPT 4:47 PM  11/10/23

## 2023-11-13 ENCOUNTER — Encounter: Payer: Self-pay | Admitting: Physical Therapy

## 2023-11-13 ENCOUNTER — Ambulatory Visit: Admitting: Physical Therapy

## 2023-11-13 DIAGNOSIS — M2141 Flat foot [pes planus] (acquired), right foot: Secondary | ICD-10-CM | POA: Diagnosis not present

## 2023-11-13 DIAGNOSIS — M2142 Flat foot [pes planus] (acquired), left foot: Secondary | ICD-10-CM | POA: Diagnosis not present

## 2023-11-13 DIAGNOSIS — M25571 Pain in right ankle and joints of right foot: Secondary | ICD-10-CM

## 2023-11-13 NOTE — Therapy (Signed)
 OUTPATIENT PHYSICAL THERAPY LOWER EXTREMITY TREATMENT   Patient Name: Frederick Ramirez MRN: [id number] DOB:[birth year], 23 y.o., male  Today's Date: 11/13/2023   END OF SESSION:  PT End of Session - 11/13/23 0809     Visit Number 10    Number of Visits 16    Date for PT Re-Evaluation 11/13/23    Authorization Type [facility]    PT Start Time 0806    PT Stop Time 0845    PT Time Calculation (min) 39 min    Activity Tolerance Patient tolerated treatment well    Behavior During Therapy WFL for tasks assessed/performed            Past Medical History:  Diagnosis Date   Allergic rhinitis    GERD (gastroesophageal reflux disease)    Pes planus of both feet    Past Surgical History:  Procedure Laterality Date   ADENOIDECTOMY     approx 2010   Patient Active Problem List   Diagnosis Date Noted   Abnormal reflexes 10/29/2023   Pain in joint, ankle and foot, right 08/07/2023   Allergic rhinitis 01/07/2023     PCP: McGowen, Phillip`  REFERRING PROVIDER:  Annemarie Kil  REFERRING DIAG: Pain in r ankle   THERAPY DIAG:  Pain in right ankle and joints of right foot  Pes planus of both feet  Rationale for Evaluation and Treatment: Rehabilitation  ONSET DATE:    SUBJECTIVE:   SUBJECTIVE STATEMENT: Pt states doing ok, reporting less pain overall.   Eval: Pt states ongoing pain in R foot/ankle. States it has been painful for a long time, but has changed a bit. He used to have more pain in arch from flat foot, but in last year or so has been in lateral ankle. States increased pain since about February where he did more walking for several days. He has been wearing boot for about 6 weeks, today is end of 6 weeks, and he is wearing lace up ankle brace for driving purposes. He goes to school in [place] but will be back in town this summer starting in 2 weeks. He does lots of walking at college which he finds to be very painful. 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 He is currently wearing [employer] shoes, also has work boots. He has custom orthotics that are comfortable at this time.    PERTINENT HISTORY: N/A  PAIN:  Are you having pain? Yes: NPRS scale: 4 /10 Pain location: lateral ankle Pain description: sore,  Aggravating factors: standing, walking  Relieving factors: rest  PRECAUTIONS: None  WEIGHT BEARING RESTRICTIONS: No  FALLS:  Has patient fallen in last 6 months? No  PLOF: Independent  PATIENT GOALS:  Decreased pain In ankle.   NEXT MD VISIT:   OBJECTIVE:   DIAGNOSTIC FINDINGS:   PATIENT SURVEYS:    COGNITION: Overall cognitive status: Within functional limits for tasks assessed     SENSATION: WFL  EDEMA:   POSTURE:  bare feet:  Pes planus bil R>L.  R with severe over pronation and navicular drop.  Shoes on: moderate/significant improvement of alignment for R foot/ankle with orthotic and sneakers on.   PALPATION: No pain to palpate lateral ankle on R.   LOWER EXTREMITY ROM: Hips/knees: WFL L ankle: WFL R ankle: limited arom for INV  LOWER EXTREMITY MMT:  MMT Left eval Right  eval  Hip flexion    Hip extension  Hip abduction    Hip adduction    Hip internal rotation    Hip external rotation    Knee flexion    Knee extension    Ankle dorsiflexion    Ankle plantarflexion    Ankle inversion    Ankle eversion     (Blank rows = not tested)  LOWER EXTREMITY SPECIAL TESTS:    FUNCTIONAL TESTS:  SLS:  significant sway on R, moderate on L.   GAIT:   TODAY'S TREATMENT:                                                                                                                              DATE:   11/13/2023 Therapeutic Exercise: Aerobic:  Supine:   prom for PF/INV ,  arom 4 way  inv/ev GTB x 20 ea;    Bridging with GTB at thighs x 20;   SL bridge x 10;  S/L:  Seated:     Standing:  heel raise reg x 15,  (V) x 15;     Stretches:  Neuromuscular Re-education:  Bosu (flat side) : weight shifts L/R and fwd/bwd x 15 ea;  mini squats x 20 ; Manual Therapy: Therapeutic Activity:   SL squat/kickstand 15lb,  x 12 bil;   Step ups with knee drive 16XW KB unilateral x 12 bil;  Self Care:   PATIENT EDUCATION:  Education details: updated and reviewed HEP.  Person educated: Patient Education method: Explanation, Demonstration, Tactile cues, Verbal cues, and Handouts Education comprehension: verbalized understanding, returned demonstration, verbal cues required, tactile cues required, and needs further education   HOME EXERCISE PROGRAM: Access Code: [email or web address] URL: [email or web address] Date: 09/20/2023 Prepared by: Terrilee Few  Exercises - Supine Bridge  - 1 x daily - 4-5 x weekly - 2 sets - 10 reps - Seated Heel Raise  - 1 x daily - 4-5 x weekly - 2 sets - 10 reps - Supine Ankle Inversion AROM  - 1 x daily - 4-5 x weekly - 2 sets - 10 reps  ASSESSMENT:  CLINICAL IMPRESSION: Continued focus on foot and LE strength/stability. Improving LE strength, but will benefit from progressive strengthening. No pain in foot today. Still has to work to loosen foot up after standing activity.   Eval: Patient presents with primary complaint of  pain in R lateral ankle. He has significant over pronation on R which is likely cause of pain. He has lack of effective HEP for his Ongoing pain. He is getting good positional alignment from current orthotics and shoes, but would benefit from updated footwear, recommendations given today. He has poor ability for Inversion ROM and poor strength, and will benefit from overall strength and stability for bil foot/ankle. He will start to transition out of boot, discussed weaning out slowly as pain allows, pt states understanding.  Pt with decreased ability for full functional activities, walking, running and exercise.  Pt will  benefit from skilled PT to improve deficits  and  pain and to return to PLOF.   OBJECTIVE IMPAIRMENTS: Abnormal gait, decreased activity tolerance, decreased ROM, decreased strength, impaired flexibility, improper body mechanics, and pain.   ACTIVITY LIMITATIONS: standing, squatting, stairs, and locomotion level  PARTICIPATION LIMITATIONS: driving, shopping, community activity, occupation, and yard work  PERSONAL FACTORS: Past/current experiences and Time since onset of injury/illness/exacerbation are also affecting patient's functional outcome.   REHAB POTENTIAL: Good  CLINICAL DECISION MAKING: Stable/uncomplicated  EVALUATION COMPLEXITY: Low   GOALS: Goals reviewed with patient? Yes  SHORT TERM GOALS: Target date: 10/02/23  Pt to be independent with initial HEP  Goal status: MET  2.  Pt to demo ability for active inversion ROM  Goal status: MET   LONG TERM GOALS: Target date: 11/13/23  Pt to be independent with final HEP  Goal status: INITIAL  2.  Pt to demo optimal mechanics with standing heel raise  Goal status: INITIAL  3.  Pt to demo max strength and stability for bil foot/ankle, to improve gait pattern  Goal status: INITIAL  4.  Pt to be compliant with footwear and/or orthotics as appropriate for his foot type , for optimal alignment and pressure relief.   Goal status: INITIAL  5.  Pt to report decreased pain levels in R foot  to 0-2/10 to improve ability for exercise and running.   Goal status: INITIAL   PLAN:  PT FREQUENCY: 1-2x/week  PT DURATION: 8 weeks  PLANNED INTERVENTIONS: Therapeutic exercises, Therapeutic activity, Neuromuscular re-education, Patient/Family education, Self Care, Joint mobilization, Joint manipulation, Stair training, Orthotic/Fit training, DME instructions, Aquatic Therapy, Dry Needling, Electrical stimulation, Cryotherapy, Moist heat, Taping, Ultrasound, Ionotophoresis 4mg /ml Dexamethasone, Manual therapy,  Vasopneumatic device, Traction, Spinal manipulation, Spinal  mobilization,Balance training, Gait training,   PLAN FOR NEXT SESSION:     Terrilee Few, PT, DPT 11:10 AM  11/13/23

## 2023-11-20 ENCOUNTER — Ambulatory Visit: Admitting: Physical Therapy

## 2023-11-20 ENCOUNTER — Encounter: Payer: Self-pay | Admitting: Physical Therapy

## 2023-11-20 DIAGNOSIS — M25571 Pain in right ankle and joints of right foot: Secondary | ICD-10-CM

## 2023-11-20 DIAGNOSIS — M2141 Flat foot [pes planus] (acquired), right foot: Secondary | ICD-10-CM | POA: Diagnosis not present

## 2023-11-20 DIAGNOSIS — M2142 Flat foot [pes planus] (acquired), left foot: Secondary | ICD-10-CM | POA: Diagnosis not present

## 2023-11-20 NOTE — Therapy (Signed)
 OUTPATIENT PHYSICAL THERAPY LOWER EXTREMITY TREATMENT/ Re-Cert    Patient Name: Frederick Ramirez MRN: [id number] DOB:2000/12/29, 23 y.o., male  Today's Date: 11/20/2023   END OF SESSION:  PT End of Session - 11/20/23 0932     Visit Number 11    Number of Visits 24    Date for PT Re-Evaluation 01/15/24    Authorization Type [facility]    PT Start Time 0934    PT Stop Time 1015    PT Time Calculation (min) 41 min    Activity Tolerance Patient tolerated treatment well    Behavior During Therapy WFL for tasks assessed/performed            Past Medical History:  Diagnosis Date   Allergic rhinitis    GERD (gastroesophageal reflux disease)    Pes planus of both feet    Past Surgical History:  Procedure Laterality Date   ADENOIDECTOMY     approx 2010   Patient Active Problem List   Diagnosis Date Noted   Abnormal reflexes 10/29/2023   Pain in joint, ankle and foot, right 08/07/2023   Allergic rhinitis 01/07/2023     PCP: McGowen, Phillip`  REFERRING PROVIDER:  Debby Petties  REFERRING DIAG: Pain in r ankle   THERAPY DIAG:  Pain in right ankle and joints of right foot  Pes planus of both feet  Rationale for Evaluation and Treatment: Rehabilitation  ONSET DATE:    SUBJECTIVE:   SUBJECTIVE STATEMENT: Pt states doing ok, reporting less pain overall.   Eval: Pt states ongoing pain in R foot/ankle. States it has been painful for a long time, but has changed a bit. He used to have more pain in arch from flat foot, but in last year or so has been in lateral ankle. States increased pain since about February where he did more walking for several days. He has been wearing boot for about 6 weeks, today is end of 6 weeks, and he is wearing lace up ankle brace for driving purposes. He goes to school in [place] but will be back in town this summer starting in 2 weeks. He does lots of walking at college which he finds to be very painful. 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 He is currently wearing [employer] shoes, also has work boots. He has custom orthotics that are comfortable at this time.    PERTINENT HISTORY: N/A  PAIN:  Are you having pain? Yes: NPRS scale: 4 /10 Pain location: lateral ankle Pain description: sore,  Aggravating factors: standing, walking  Relieving factors: rest  PRECAUTIONS: None  WEIGHT BEARING RESTRICTIONS: No  FALLS:  Has patient fallen in last 6 months? No  PLOF: Independent  PATIENT GOALS:  Decreased pain In ankle.   NEXT MD VISIT:   OBJECTIVE: updated 6/27   DIAGNOSTIC FINDINGS:   PATIENT SURVEYS:    COGNITION: Overall cognitive status: Within functional limits for tasks assessed     SENSATION: WFL  EDEMA:   POSTURE:  bare feet:  Pes planus bil R>L.  R with severe over pronation and navicular drop.  Shoes on: moderate/significant improvement of alignment for R foot/ankle with orthotic and sneakers on.   PALPATION: No pain to palpate lateral ankle on R.   LOWER EXTREMITY ROM: Hips/knees: WFL L ankle: WFL R ankle: WFL   LOWER EXTREMITY MMT:  MMT Left eval Right  eval Left  Right  Hip flexion 4+ 4- 5  4+  Hip extension      Hip abduction 4+ 4- 5 4  Hip adduction      Hip internal rotation      Hip external rotation      Knee flexion 5 4+ 5 4+  Knee extension 5 4 5  4+  Ankle dorsiflexion      Ankle plantarflexion 4 3-  4-  Ankle inversion      Ankle eversion       (Blank rows = not tested)  LOWER EXTREMITY SPECIAL TESTS:    FUNCTIONAL TESTS:  SLS:  moderate sway on R, mild  on L.   GAIT:   TODAY'S TREATMENT:                                                                                                                              DATE:   11/20/2023 Therapeutic Exercise: Aerobic:  Supine:   prom for PF/INV ,  arom 4 way  inv/ev/ PF GTB x 20 ea;    Bridging x 20;   SL bridge x 10 bil;  S/L:  Seated:      Standing:  heel raise reg x 15,  HR with eccentric lowering SL x 10 bil;   Stretches:  Neuromuscular Re-education:  Bosu (flat side) : weight shifts L/R and fwd/bwd x 15 ea;  mini squats x 20 ; Manual Therapy: Therapeutic Activity:   Squats(bar)  25 lb x 20 ;    Step ups with knee drive 84oa KB unilateral x 12 bil;  Self Care:   PATIENT EDUCATION:  Education details: updated and reviewed HEP.  Person educated: Patient Education method: Explanation, Demonstration, Tactile cues, Verbal cues, and Handouts Education comprehension: verbalized understanding, returned demonstration, verbal cues required, tactile cues required, and needs further education   HOME EXERCISE PROGRAM: Access Code: [email or web address] URL: [email or web address] Date: 09/20/2023 Prepared by: Tinnie Don  Exercises - Supine Bridge  - 1 x daily - 4-5 x weekly - 2 sets - 10 reps - Seated Heel Raise  - 1 x daily - 4-5 x weekly - 2 sets - 10 reps - Supine Ankle Inversion AROM  - 1 x daily - 4-5 x weekly - 2 sets - 10 reps  ASSESSMENT:  CLINICAL IMPRESSION: Pt has been seen for 11 visits. He is showing good strength improvements overall in R LE, hip and knee since Eval. He also has improved strength in R foot, and much improved ability and mechanics for ther ex. He has improving stability, still challenged with single leg stability in foot/ankle.  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  He has been compliant with good footwear and orthotics. He does continue to have instances of R foot getting stuck in everted position, which takes a few minutes of performing active or passive rom to loosen it up, this has been a trend since Start of  care that has not improved much. Reviewed with referring MD today.  He will benefit from Continued focus on foot and LE strength/stability.   Eval: Patient presents with primary complaint of   pain in R lateral ankle. He has significant over pronation on R which is likely cause of pain. He has lack of effective HEP for his Ongoing pain. He is getting good positional alignment from current orthotics and shoes, but would benefit from updated footwear, recommendations given today. He has poor ability for Inversion ROM and poor strength, and will benefit from overall strength and stability for bil foot/ankle. He will start to transition out of boot, discussed weaning out slowly as pain allows, pt states understanding.  Pt with decreased ability for full functional activities, walking, running and exercise.  Pt will  benefit from skilled PT to improve deficits and pain and to return to PLOF.   OBJECTIVE IMPAIRMENTS: Abnormal gait, decreased activity tolerance, decreased ROM, decreased strength, impaired flexibility, improper body mechanics, and pain.   ACTIVITY LIMITATIONS: standing, squatting, stairs, and locomotion level  PARTICIPATION LIMITATIONS: driving, shopping, community activity, occupation, and yard work  PERSONAL FACTORS: Past/current experiences and Time since onset of injury/illness/exacerbation are also affecting patient's functional outcome.   REHAB POTENTIAL: Good  CLINICAL DECISION MAKING: Stable/uncomplicated  EVALUATION COMPLEXITY: Low   GOALS: Goals reviewed with patient? Yes  SHORT TERM GOALS: Target date: 10/02/23  Pt to be independent with initial HEP  Goal status: MET  2.  Pt to demo ability for active inversion ROM  Goal status: MET   LONG TERM GOALS: Target date: 01/15/24  Pt to be independent with final HEP  Goal status: In progress   2.  Pt to demo optimal mechanics with standing heel raise  Goal status: In progress   3.  Pt to demo max strength and stability for bil foot/ankle, to improve gait pattern  Goal status: In progress   4.  Pt to be compliant with footwear and/or orthotics as appropriate for his foot type , for optimal alignment  and pressure relief.   Goal status: MET  5.  Pt to report decreased pain levels in R foot  to 0-2/10 to improve ability for exercise and running.   Goal status: In progress    PLAN:  PT FREQUENCY: 1-2x/week  PT DURATION: 8 weeks  PLANNED INTERVENTIONS: Therapeutic exercises, Therapeutic activity, Neuromuscular re-education, Patient/Family education, Self Care, Joint mobilization, Joint manipulation, Stair training, Orthotic/Fit training, DME instructions, Aquatic Therapy, Dry Needling, Electrical stimulation, Cryotherapy, Moist heat, Taping, Ultrasound, Ionotophoresis 4mg /ml Dexamethasone, Manual therapy,  Vasopneumatic device, Traction, Spinal manipulation, Spinal mobilization,Balance training, Gait training,   PLAN FOR NEXT SESSION:     Tinnie Don, PT, DPT 9:32 AM  11/20/23

## 2023-11-23 ENCOUNTER — Other Ambulatory Visit (HOSPITAL_COMMUNITY): Payer: Self-pay

## 2023-12-02 ENCOUNTER — Encounter: Payer: Self-pay | Admitting: Physical Therapy

## 2023-12-02 ENCOUNTER — Ambulatory Visit: Admitting: Physical Therapy

## 2023-12-02 DIAGNOSIS — M2141 Flat foot [pes planus] (acquired), right foot: Secondary | ICD-10-CM

## 2023-12-02 DIAGNOSIS — M25571 Pain in right ankle and joints of right foot: Secondary | ICD-10-CM | POA: Diagnosis not present

## 2023-12-02 DIAGNOSIS — M2142 Flat foot [pes planus] (acquired), left foot: Secondary | ICD-10-CM

## 2023-12-02 NOTE — Therapy (Signed)
 OUTPATIENT PHYSICAL THERAPY LOWER EXTREMITY TREATMENT   Patient Name: Frederick Ramirez MRN: [id number] DOB:09-27-2000, 23 y.o., male  Today's Date: 12/02/2023   END OF SESSION:  PT End of Session - 12/02/23 0809     Visit Number 12    Number of Visits 24    Date for PT Re-Evaluation 01/15/24    Authorization Type [facility]    PT Start Time 0804    PT Stop Time 0845    PT Time Calculation (min) 41 min    Activity Tolerance Patient tolerated treatment well    Behavior During Therapy WFL for tasks assessed/performed            Past Medical History:  Diagnosis Date   Allergic rhinitis    GERD (gastroesophageal reflux disease)    Pes planus of both feet    Past Surgical History:  Procedure Laterality Date   ADENOIDECTOMY     approx 2010   Patient Active Problem List   Diagnosis Date Noted   Abnormal reflexes 10/29/2023   Pain in joint, ankle and foot, right 08/07/2023   Allergic rhinitis 01/07/2023     PCP: McGowen, Phillip`  REFERRING PROVIDER:  Debby Petties  REFERRING DIAG: Pain in r ankle   THERAPY DIAG:  Pain in right ankle and joints of right foot  Pes planus of both feet  Rationale for Evaluation and Treatment: Rehabilitation  ONSET DATE:    SUBJECTIVE:   SUBJECTIVE STATEMENT: Pt states doing well. No pain while he was away last week, and when he is not working. Slight pain at end of day yesterday with returning to work/standing this week. He is still deciding on whether or not to play lacrosse when he returns to school.   Eval: Pt states ongoing pain in R foot/ankle. States it has been painful for a long time, but has changed a bit. He used to have more pain in arch from flat foot, but in last year or so has been in lateral ankle. States increased pain since about February where he did more walking for several days. He has been wearing boot for about 6 weeks, today is end of 6 weeks, and he is wearing lace up ankle brace for driving  purposes. He goes to school in [place] but will be back in town this summer starting in 2 weeks. He does lots of walking at college which he finds to be very painful. 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 He is currently wearing [employer] shoes, also has work boots. He has custom orthotics that are comfortable at this time.    PERTINENT HISTORY: N/A  PAIN:  Are you having pain? Yes: NPRS scale: up to 4 /10 Pain location: lateral ankle Pain description: sore,  Aggravating factors: standing, walking  Relieving factors: rest  PRECAUTIONS: None  WEIGHT BEARING RESTRICTIONS: No  FALLS:  Has patient fallen in last 6 months? No  PLOF: Independent  PATIENT GOALS:  Decreased pain In ankle.   NEXT MD VISIT:   OBJECTIVE: updated 6/27   DIAGNOSTIC FINDINGS:   PATIENT SURVEYS:    COGNITION: Overall cognitive status: Within functional limits for tasks assessed     SENSATION: WFL  EDEMA:   POSTURE:  bare feet:  Pes planus bil R>L.  R with severe over pronation and navicular drop.  Shoes on: moderate/significant improvement of alignment for R foot/ankle with orthotic and sneakers on.   PALPATION: No pain to  palpate lateral ankle on R.   LOWER EXTREMITY ROM: Hips/knees: WFL L ankle: WFL R ankle: WFL   LOWER EXTREMITY MMT:  MMT Left eval Right  eval Left  Right  Hip flexion 4+ 4- 5 4+  Hip extension      Hip abduction 4+ 4- 5 4  Hip adduction      Hip internal rotation      Hip external rotation      Knee flexion 5 4+ 5 4+  Knee extension 5 4 5  4+  Ankle dorsiflexion      Ankle plantarflexion 4 3-  4-  Ankle inversion      Ankle eversion       (Blank rows = not tested)  LOWER EXTREMITY SPECIAL TESTS:    FUNCTIONAL TESTS:  SLS:  moderate sway on R, mild  on L.   GAIT:   TODAY'S TREATMENT:                                                                                                                               DATE:   12/02/2023 Therapeutic Exercise: Aerobic:  Supine:   prom for PF/INV ,   S/L:  Seated:     Standing:  heel raise reg x 20,  Heel raise V x 20;  Stretches:  Neuromuscular Re-education:  Bosu -round side,  mini squats x 20 ; lunges onto bosu x 10 bil;  small hops 2 x 10; small squat jump 2 x 10;  Manual Therapy: Therapeutic Activity:   Squats  25 lb KB x 20 ;   SL sit to stand 2 x 10 bil;  Walking lunges x 6 ; walk/march 20lb unilateral carry x 4;   Self Care:   PATIENT EDUCATION:  Education details: updated and reviewed HEP.  Person educated: Patient Education method: Explanation, Demonstration, Tactile cues, Verbal cues, and Handouts Education comprehension: verbalized understanding, returned demonstration, verbal cues required, tactile cues required, and needs further education   HOME EXERCISE PROGRAM: Access Code: [email or web address] URL: [email or web address] Date: 09/20/2023 Prepared by: Tinnie Don  Exercises - Supine Bridge  - 1 x daily - 4-5 x weekly - 2 sets - 10 reps - Seated Heel Raise  - 1 x daily - 4-5 x weekly - 2 sets - 10 reps - Supine Ankle Inversion AROM  - 1 x daily - 4-5 x weekly - 2 sets - 10 reps  ASSESSMENT:  CLINICAL IMPRESSION: Pt doing well with strengthening, Still noted weakness in R foot/calf vs L, seen with HR and with jumping today, decreased push off on R. No increased pain with strength or jumping however. Discussed increasing walking, and slow interval jogging at this time, if pt wants to play lacrosse at school. Also discussed asking MD if he agrees with pt trying to play. Pt to benefit from continued care.   Eval: Patient presents with primary complaint of  pain in R lateral ankle. He has significant over  pronation on R which is likely cause of pain. He has lack of effective HEP for his Ongoing pain. He is getting good positional alignment from current orthotics and shoes, but would benefit from updated  footwear, recommendations given today. He has poor ability for Inversion ROM and poor strength, and will benefit from overall strength and stability for bil foot/ankle. He will start to transition out of boot, discussed weaning out slowly as pain allows, pt states understanding.  Pt with decreased ability for full functional activities, walking, running and exercise.  Pt will  benefit from skilled PT to improve deficits and pain and to return to PLOF.   OBJECTIVE IMPAIRMENTS: Abnormal gait, decreased activity tolerance, decreased ROM, decreased strength, impaired flexibility, improper body mechanics, and pain.   ACTIVITY LIMITATIONS: standing, squatting, stairs, and locomotion level  PARTICIPATION LIMITATIONS: driving, shopping, community activity, occupation, and yard work  PERSONAL FACTORS: Past/current experiences and Time since onset of injury/illness/exacerbation are also affecting patient's functional outcome.   REHAB POTENTIAL: Good  CLINICAL DECISION MAKING: Stable/uncomplicated  EVALUATION COMPLEXITY: Low   GOALS: Goals reviewed with patient? Yes  SHORT TERM GOALS: Target date: 10/02/23  Pt to be independent with initial HEP  Goal status: MET  2.  Pt to demo ability for active inversion ROM  Goal status: MET   LONG TERM GOALS: Target date: 01/15/24  Pt to be independent with final HEP  Goal status: In progress   2.  Pt to demo optimal mechanics with standing heel raise  Goal status: In progress   3.  Pt to demo max strength and stability for bil foot/ankle, to improve gait pattern  Goal status: In progress   4.  Pt to be compliant with footwear and/or orthotics as appropriate for his foot type , for optimal alignment and pressure relief.   Goal status: MET  5.  Pt to report decreased pain levels in R foot  to 0-2/10 to improve ability for exercise and running.   Goal status: In progress    PLAN:  PT FREQUENCY: 1-2x/week  PT DURATION: 8 weeks  PLANNED  INTERVENTIONS: Therapeutic exercises, Therapeutic activity, Neuromuscular re-education, Patient/Family education, Self Care, Joint mobilization, Joint manipulation, Stair training, Orthotic/Fit training, DME instructions, Aquatic Therapy, Dry Needling, Electrical stimulation, Cryotherapy, Moist heat, Taping, Ultrasound, Ionotophoresis 4mg /ml Dexamethasone, Manual therapy,  Vasopneumatic device, Traction, Spinal manipulation, Spinal mobilization,Balance training, Gait training,   PLAN FOR NEXT SESSION:     Tinnie Don, PT, DPT 2:36 PM  12/02/23

## 2023-12-04 ENCOUNTER — Ambulatory Visit: Admitting: Physical Therapy

## 2023-12-04 ENCOUNTER — Encounter: Payer: Self-pay | Admitting: Physical Therapy

## 2023-12-04 DIAGNOSIS — M2141 Flat foot [pes planus] (acquired), right foot: Secondary | ICD-10-CM

## 2023-12-04 DIAGNOSIS — M25571 Pain in right ankle and joints of right foot: Secondary | ICD-10-CM | POA: Diagnosis not present

## 2023-12-04 DIAGNOSIS — M2142 Flat foot [pes planus] (acquired), left foot: Secondary | ICD-10-CM | POA: Diagnosis not present

## 2023-12-04 NOTE — Therapy (Signed)
 OUTPATIENT PHYSICAL THERAPY LOWER EXTREMITY TREATMENT   Patient Name: Frederick Ramirez MRN: [id number] DOB:2000-11-03, 23 y.o., male  Today's Date: 12/04/2023   END OF SESSION:  PT End of Session - 12/04/23 0933     Visit Number 13    Number of Visits 24    Date for PT Re-Evaluation 01/15/24    Authorization Type [facility]    PT Start Time 0934    PT Stop Time 1015    PT Time Calculation (min) 41 min    Activity Tolerance Patient tolerated treatment well    Behavior During Therapy WFL for tasks assessed/performed            Past Medical History:  Diagnosis Date   Allergic rhinitis    GERD (gastroesophageal reflux disease)    Pes planus of both feet    Past Surgical History:  Procedure Laterality Date   ADENOIDECTOMY     approx 2010   Patient Active Problem List   Diagnosis Date Noted   Abnormal reflexes 10/29/2023   Pain in joint, ankle and foot, right 08/07/2023   Allergic rhinitis 01/07/2023     PCP: McGowen, Phillip`  REFERRING PROVIDER:  Debby Petties  REFERRING DIAG: Pain in r ankle   THERAPY DIAG:  Pain in right ankle and joints of right foot  Pes planus of both feet  Rationale for Evaluation and Treatment: Rehabilitation  ONSET DATE:    SUBJECTIVE:   SUBJECTIVE STATEMENT: Pt states doing well.    Eval: Pt states ongoing pain in R foot/ankle. States it has been painful for a long time, but has changed a bit. He used to have more pain in arch from flat foot, but in last year or so has been in lateral ankle. States increased pain since about February where he did more walking for several days. He has been wearing boot for about 6 weeks, today is end of 6 weeks, and he is wearing lace up ankle brace for driving purposes. He goes to school in [place] but will be back in town this summer starting in 2 weeks. He does lots of walking at college which he finds to be very painful. 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 He is currently wearing On[employer] shoes, also has work boots. He has custom orthotics that are comfortable at this time.    PERTINENT HISTORY: N/A  PAIN:  Are you having pain? Yes: NPRS scale: up to 4 /10 Pain location: lateral ankle Pain description: sore,  Aggravating factors: standing, walking  Relieving factors: rest  PRECAUTIONS: None  WEIGHT BEARING RESTRICTIONS: No  FALLS:  Has patient fallen in last 6 months? No  PLOF: Independent  PATIENT GOALS:  Decreased pain In ankle.   NEXT MD VISIT:   OBJECTIVE: updated 6/27   DIAGNOSTIC FINDINGS:   PATIENT SURVEYS:    COGNITION: Overall cognitive status: Within functional limits for tasks assessed     SENSATION: WFL  EDEMA:   POSTURE:  bare feet:  Pes planus bil R>L.  R with severe over pronation and navicular drop.  Shoes on: moderate/significant improvement of alignment for R foot/ankle with orthotic and sneakers on.   PALPATION: No pain to palpate lateral ankle on R.   LOWER EXTREMITY ROM: Hips/knees: WFL L ankle: WFL R ankle: WFL   LOWER EXTREMITY MMT:  MMT Left eval Right  eval Left  Right  Hip flexion 4+ 4- 5 4+  Hip  extension      Hip abduction 4+ 4- 5 4  Hip adduction      Hip internal rotation      Hip external rotation      Knee flexion 5 4+ 5 4+  Knee extension 5 4 5  4+  Ankle dorsiflexion      Ankle plantarflexion 4 3-  4-  Ankle inversion      Ankle eversion       (Blank rows = not tested)  LOWER EXTREMITY SPECIAL TESTS:    FUNCTIONAL TESTS:  SLS:  moderate sway on R, mild  on L.   GAIT:   TODAY'S TREATMENT:                                                                                                                              DATE:   12/04/2023 Therapeutic Exercise: Aerobic:  bike 6 min L 2.  Supine:  prom for PF/INV ;  Bridging x 15;   SL 2 x 10 bil;  S/L:  Seated:     Standing:  heel raise on step  x 20,   Stretches:   Neuromuscular Re-education:  Bosu -flat side-  squats x 20 ;  lunges onto bosu (round) x 10 bil;    Manual Therapy: Therapeutic Activity:   Squats  45 lb bar x 8 ;    Walking lunges x 4 ;  Step ups with knee drive- unilateral 15 lb KB hold x 15 bil;  Self Care:   PATIENT EDUCATION:  Education details: updated and reviewed HEP.  Person educated: Patient Education method: Explanation, Demonstration, Tactile cues, Verbal cues, and Handouts Education comprehension: verbalized understanding, returned demonstration, verbal cues required, tactile cues required, and needs further education   HOME EXERCISE PROGRAM: Access Code: [email or web address] URL: [email or web address] Date: 09/20/2023 Prepared by: Tinnie Don  Exercises - Supine Bridge  - 1 x daily - 4-5 x weekly - 2 sets - 10 reps - Seated Heel Raise  - 1 x daily - 4-5 x weekly - 2 sets - 10 reps - Supine Ankle Inversion AROM  - 1 x daily - 4-5 x weekly - 2 sets - 10 reps  ASSESSMENT:  CLINICAL IMPRESSION: Pt progressing well. Strength deficits noted with single leg L vs R, but overall strength improving. Pt to benefit from continued care.   Eval: Patient presents with primary complaint of  pain in R lateral ankle. He has significant over pronation on R which is likely cause of pain. He has lack of effective HEP for his Ongoing pain. He is getting good positional alignment from current orthotics and shoes, but would benefit from updated footwear, recommendations given today. He has poor ability for Inversion ROM and poor strength, and will benefit from overall strength and stability for bil foot/ankle. He will start to transition out of boot, discussed weaning out slowly as pain allows, pt states understanding.  Pt with decreased ability for full functional activities,  walking, running and exercise.  Pt will  benefit from skilled PT to improve deficits and pain and to return to PLOF.   OBJECTIVE IMPAIRMENTS: Abnormal gait,  decreased activity tolerance, decreased ROM, decreased strength, impaired flexibility, improper body mechanics, and pain.   ACTIVITY LIMITATIONS: standing, squatting, stairs, and locomotion level  PARTICIPATION LIMITATIONS: driving, shopping, community activity, occupation, and yard work  PERSONAL FACTORS: Past/current experiences and Time since onset of injury/illness/exacerbation are also affecting patient's functional outcome.   REHAB POTENTIAL: Good  CLINICAL DECISION MAKING: Stable/uncomplicated  EVALUATION COMPLEXITY: Low   GOALS: Goals reviewed with patient? Yes  SHORT TERM GOALS: Target date: 10/02/23  Pt to be independent with initial HEP  Goal status: MET  2.  Pt to demo ability for active inversion ROM  Goal status: MET   LONG TERM GOALS: Target date: 01/15/24  Pt to be independent with final HEP  Goal status: In progress   2.  Pt to demo optimal mechanics with standing heel raise  Goal status: In progress   3.  Pt to demo max strength and stability for bil foot/ankle, to improve gait pattern  Goal status: In progress   4.  Pt to be compliant with footwear and/or orthotics as appropriate for his foot type , for optimal alignment and pressure relief.   Goal status: MET  5.  Pt to report decreased pain levels in R foot  to 0-2/10 to improve ability for exercise and running.   Goal status: In progress    PLAN:  PT FREQUENCY: 1-2x/week  PT DURATION: 8 weeks  PLANNED INTERVENTIONS: Therapeutic exercises, Therapeutic activity, Neuromuscular re-education, Patient/Family education, Self Care, Joint mobilization, Joint manipulation, Stair training, Orthotic/Fit training, DME instructions, Aquatic Therapy, Dry Needling, Electrical stimulation, Cryotherapy, Moist heat, Taping, Ultrasound, Ionotophoresis 4mg /ml Dexamethasone, Manual therapy,  Vasopneumatic device, Traction, Spinal manipulation, Spinal mobilization,Balance training, Gait training,   PLAN FOR  NEXT SESSION:     Tinnie Don, PT, DPT 9:33 AM  12/04/23

## 2023-12-07 ENCOUNTER — Encounter: Payer: Self-pay | Admitting: Physical Therapy

## 2023-12-07 ENCOUNTER — Ambulatory Visit: Admitting: Physical Therapy

## 2023-12-07 DIAGNOSIS — M25571 Pain in right ankle and joints of right foot: Secondary | ICD-10-CM | POA: Diagnosis not present

## 2023-12-07 DIAGNOSIS — M2142 Flat foot [pes planus] (acquired), left foot: Secondary | ICD-10-CM

## 2023-12-07 DIAGNOSIS — M2141 Flat foot [pes planus] (acquired), right foot: Secondary | ICD-10-CM

## 2023-12-07 NOTE — Therapy (Signed)
 OUTPATIENT PHYSICAL THERAPY LOWER EXTREMITY TREATMENT   Patient Name: Frederick Ramirez MRN: [id number] DOB:August 11, 2000, 23 y.o., male  Today's Date: 12/07/2023   END OF SESSION:  PT End of Session - 12/07/23 1529     Visit Number 14    Number of Visits 24    Date for PT Re-Evaluation 01/15/24    Authorization Type [facility]    PT Start Time 1523    PT Stop Time 1601    PT Time Calculation (min) 38 min    Activity Tolerance Patient tolerated treatment well    Behavior During Therapy WFL for tasks assessed/performed            Past Medical History:  Diagnosis Date   Allergic rhinitis    GERD (gastroesophageal reflux disease)    Pes planus of both feet    Past Surgical History:  Procedure Laterality Date   ADENOIDECTOMY     approx 2010   Patient Active Problem List   Diagnosis Date Noted   Abnormal reflexes 10/29/2023   Pain in joint, ankle and foot, right 08/07/2023   Allergic rhinitis 01/07/2023     PCP: McGowen, Phillip`  REFERRING PROVIDER:  Debby Petties  REFERRING DIAG: Pain in r ankle   THERAPY DIAG:  Pain in right ankle and joints of right foot  Pes planus of both feet  Rationale for Evaluation and Treatment: Rehabilitation  ONSET DATE:    SUBJECTIVE:   SUBJECTIVE STATEMENT: Pt states doing well. Tired from working/standing today    Eval: Pt states ongoing pain in R foot/ankle. States it has been painful for a long time, but has changed a bit. He used to have more pain in arch from flat foot, but in last year or so has been in lateral ankle. States increased pain since about February where he did more walking for several days. He has been wearing boot for about 6 weeks, today is end of 6 weeks, and he is wearing lace up ankle brace for driving purposes. He goes to school in [place] but will be back in town this summer starting in 2 weeks. He does lots of walking at college which he finds to be very painful. 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 He is currently wearing [employer] shoes, also has work boots. He has custom orthotics that are comfortable at this time.    PERTINENT HISTORY: N/A  PAIN:  Are you having pain? Yes: NPRS scale: up to 4 /10 Pain location: lateral ankle Pain description: sore,  Aggravating factors: standing, walking  Relieving factors: rest  PRECAUTIONS: None  WEIGHT BEARING RESTRICTIONS: No  FALLS:  Has patient fallen in last 6 months? No  PLOF: Independent  PATIENT GOALS:  Decreased pain In ankle.   NEXT MD VISIT:   OBJECTIVE: updated 6/27   DIAGNOSTIC FINDINGS:   PATIENT SURVEYS:    COGNITION: Overall cognitive status: Within functional limits for tasks assessed     SENSATION: WFL  EDEMA:   POSTURE:  bare feet:  Pes planus bil R>L.  R with severe over pronation and navicular drop.  Shoes on: moderate/significant improvement of alignment for R foot/ankle with orthotic and sneakers on.   PALPATION: No pain to palpate lateral ankle on R.   LOWER EXTREMITY ROM: Hips/knees: WFL L ankle: WFL R ankle: WFL   LOWER EXTREMITY MMT:  MMT Left eval Right  eval Left  Right  Hip flexion 4+ 4-  5 4+  Hip extension      Hip abduction 4+ 4- 5 4  Hip adduction      Hip internal rotation      Hip external rotation      Knee flexion 5 4+ 5 4+  Knee extension 5 4 5  4+  Ankle dorsiflexion      Ankle plantarflexion 4 3-  4-  Ankle inversion      Ankle eversion       (Blank rows = not tested)  LOWER EXTREMITY SPECIAL TESTS:    FUNCTIONAL TESTS:  SLS:  moderate sway on R, mild  on L.   GAIT:   TODAY'S TREATMENT:                                                                                                                              DATE:   12/07/2023 Therapeutic Exercise: Aerobic:   Supine:  prom for PF/INV ;   GTB 4 way x 15 ea;prom pf/inv to loosen up foot x 3 throughout session S/L:  Seated:      Standing:  heel raises x 20,   hip abd 2 x 10 bil YTB ;  toe walking- unable Stretches:  Neuromuscular Re-education:  Squats : Bosu -flat side  x 15 ;  round side x 15;  Manual Therapy: Therapeutic Activity:      Walking lunges x 4 ;  Walking march/ 20lb unilateral hold x 6;   Self Care:   PATIENT EDUCATION:  Education details: updated and reviewed HEP.  Person educated: Patient Education method: Explanation, Demonstration, Tactile cues, Verbal cues, and Handouts Education comprehension: verbalized understanding, returned demonstration, verbal cues required, tactile cues required, and needs further education   HOME EXERCISE PROGRAM: Access Code: [email or web address] URL: [email or web address] Date: 09/20/2023 Prepared by: Tinnie Don  Exercises - Supine Bridge  - 1 x daily - 4-5 x weekly - 2 sets - 10 reps - Seated Heel Raise  - 1 x daily - 4-5 x weekly - 2 sets - 10 reps - Supine Ankle Inversion AROM  - 1 x daily - 4-5 x weekly - 2 sets - 10 reps  ASSESSMENT:  CLINICAL IMPRESSION: Pt progressing well. Pt showing overall strength improvements. Foot/ankle is tired today after working all day. He has more difficulty with heel raises today due to initial stiffness. Trial for heel walking, unable, will continue to try in future. Pt to benefit from continued care.   Eval: Patient presents with primary complaint of  pain in R lateral ankle. He has significant over pronation on R which is likely cause of pain. He has lack of effective HEP for his Ongoing pain. He is getting good positional alignment from current orthotics and shoes, but would benefit from updated footwear, recommendations given today. He has poor ability for Inversion ROM and poor strength, and will benefit from overall strength and stability for bil foot/ankle. He will start to transition out of boot,  discussed weaning out slowly as pain allows, pt states understanding.  Pt with decreased ability for full functional  activities, walking, running and exercise.  Pt will  benefit from skilled PT to improve deficits and pain and to return to PLOF.   OBJECTIVE IMPAIRMENTS: Abnormal gait, decreased activity tolerance, decreased ROM, decreased strength, impaired flexibility, improper body mechanics, and pain.   ACTIVITY LIMITATIONS: standing, squatting, stairs, and locomotion level  PARTICIPATION LIMITATIONS: driving, shopping, community activity, occupation, and yard work  PERSONAL FACTORS: Past/current experiences and Time since onset of injury/illness/exacerbation are also affecting patient's functional outcome.   REHAB POTENTIAL: Good  CLINICAL DECISION MAKING: Stable/uncomplicated  EVALUATION COMPLEXITY: Low   GOALS: Goals reviewed with patient? Yes  SHORT TERM GOALS: Target date: 10/02/23  Pt to be independent with initial HEP  Goal status: MET  2.  Pt to demo ability for active inversion ROM  Goal status: MET   LONG TERM GOALS: Target date: 01/15/24  Pt to be independent with final HEP  Goal status: In progress   2.  Pt to demo optimal mechanics with standing heel raise  Goal status: In progress   3.  Pt to demo max strength and stability for bil foot/ankle, to improve gait pattern  Goal status: In progress   4.  Pt to be compliant with footwear and/or orthotics as appropriate for his foot type , for optimal alignment and pressure relief.   Goal status: MET  5.  Pt to report decreased pain levels in R foot  to 0-2/10 to improve ability for exercise and running.   Goal status: In progress    PLAN:  PT FREQUENCY: 1-2x/week  PT DURATION: 8 weeks  PLANNED INTERVENTIONS: Therapeutic exercises, Therapeutic activity, Neuromuscular re-education, Patient/Family education, Self Care, Joint mobilization, Joint manipulation, Stair training, Orthotic/Fit training, DME instructions, Aquatic Therapy, Dry Needling, Electrical stimulation, Cryotherapy, Moist heat, Taping, Ultrasound,  Ionotophoresis 4mg /ml Dexamethasone, Manual therapy,  Vasopneumatic device, Traction, Spinal manipulation, Spinal mobilization,Balance training, Gait training,   PLAN FOR NEXT SESSION:     Tinnie Don, PT, DPT 3:29 PM  12/07/23

## 2023-12-15 ENCOUNTER — Ambulatory Visit (INDEPENDENT_AMBULATORY_CARE_PROVIDER_SITE_OTHER): Admitting: Neurology

## 2023-12-15 ENCOUNTER — Encounter: Payer: Self-pay | Admitting: Neurology

## 2023-12-15 VITALS — BP 124/80 | Ht 74.0 in | Wt 228.0 lb

## 2023-12-15 DIAGNOSIS — M25571 Pain in right ankle and joints of right foot: Secondary | ICD-10-CM

## 2023-12-15 DIAGNOSIS — G8929 Other chronic pain: Secondary | ICD-10-CM

## 2023-12-15 NOTE — Progress Notes (Signed)
 Chief Complaint  Patient presents with   New Patient (Initial Visit)    Rm 14, NP alone, Abnormal reflexes of right foot since 06/2023, injury to foot/ankle 06/2023      ASSESSMENT AND PLAN  Frederick Ramirez is a 23 y.o. male Right ankle injury  Fairly normal neurological examination, in specific, no evidence of hyperreflexia, or ankle clonus noted  No further neurological examination is needed   DIAGNOSTIC DATA (LABS, IMAGING, TESTING) - I reviewed patient records, labs, notes, testing and imaging myself where available.   MEDICAL HISTORY:  Frederick Ramirez is a 23 year old male, seen in request by  Orthopedic doctor Curtis Ned for concerning of hyperreflexia, his primary care is Dr. Candise, Aleene DEL, initial evaluation December 15, 2023  History is obtained from the patient and review of electronic medical records. I personally reviewed pertinent available imaging films in PACS.   PMHx of   He twisted his right foot without clear trigger in February 2025, have to wear a rigid right boot for few weeks, at later checkup, he was found to have right ankle jerking, worry about hyperreflexia  He was referred for extensive imaging study, MRI of the brain with without contrast November 04, 2023 only mild small vessel disease no acute abnormality MRI of cervical, lumbar, thoracic spine, no significant abnormality  MRI of right ankle and right foot in March 2025, in for 7 of the normal sinus Tarsi fat with diffuse soft tissue edema and severe subcortical marrow edema in the neck of calcaneus adjacent to the sinus Tarsi and to lesser extent along with adjacent mid talus, 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  PHYSICAL EXAMNIATION:  Gen: NAD, conversant, well nourised, well groomed                     Cardiovascular: Regular rate rhythm, no peripheral edema, warm, nontender. Eyes: Conjunctivae clear without exudates or hemorrhage Neck: Supple, no carotid bruits. Pulmonary: Clear to auscultation bilaterally   NEUROLOGICAL EXAM:  MENTAL STATUS: Speech/cognition: Awake, alert, oriented to history taking and casual conversation CRANIAL NERVES: CN II: Visual fields are full to confrontation. Pupils are round equal and briskly reactive to light. CN III, IV, VI: extraocular movement are normal. No ptosis. CN V: Facial sensation is intact to light touch CN VII: Face is symmetric with normal eye closure  CN VIII: Hearing is normal to causal conversation. CN IX, X: Phonation is normal. CN XI: Head turning and shoulder shrug are intact  MOTOR: There is no pronator drift of out-stretched arms. Muscle bulk and tone are normal. Muscle strength is normal.  REFLEXES: Reflexes are 2+ and symmetric at the biceps, triceps, knees, and trace at ankles. Plantar responses are flexor.  SENSORY: Intact to light touch, pinprick and vibratory sensation are intact in fingers and toes.  COORDINATION: There is no trunk or limb dysmetria noted.  GAIT/STANCE: Posture is normal. Gait is steady  with bilateral flatfoot  REVIEW OF SYSTEMS:  Full 14 system review of systems performed and notable only for as above All other review of systems were negative.   ALLERGIES: No Known Allergies  HOME MEDICATIONS: Current Outpatient Medications  Medication Sig Dispense Refill   azelastine  (ASTELIN ) 0.1 % nasal spray Use 1 spray in each nostril twice a day (Patient  taking differently: Place into both nostrils as needed.) 30 mL 3   desloratadine  (CLARINEX ) 5 MG tablet Take 5 mg by mouth.     EPINEPHrine  0.3 mg/0.3 mL IJ SOAJ injection Inject 1 syringe into the outer thigh as needed for anaphylaxis 2 each 0   Fluticasone Propionate (FLONASE NA) Place 1 spray into the nose as needed.     meloxicam  (MOBIC ) 15 MG tablet Take 1 tablet by mouth every 24 hours with a meal for 2 weeks, then once every 24 hours if needed for pain 30 tablet 3   Sodium Fluoride  (PREVIDENT  5000 BOOSTER PLUS) 1.1 % PSTE Brush on teeth twice daily for 2 minutes each time. Spit out as mush as possible and do not swish, eat or drink for 30 minutes after treatment. 100 mL 99   No current facility-administered medications for this visit.    PAST MEDICAL HISTORY: Past Medical History:  Diagnosis Date   Allergic rhinitis    GERD (gastroesophageal reflux disease)    Pes planus of both feet     PAST SURGICAL HISTORY: Past Surgical History:  Procedure Laterality Date   ADENOIDECTOMY     approx 2010    FAMILY HISTORY: Family History  Problem Relation Age of Onset   Arthritis Mother    Arthritis Father    Depression Maternal Grandmother    High blood pressure Maternal Grandmother    Arthritis Maternal Grandfather    Heart disease Maternal Grandfather    High Cholesterol Maternal Grandfather    High blood pressure Maternal Grandfather    High blood pressure Paternal Grandmother    Arthritis Paternal Grandmother    Hearing loss Paternal Grandfather     SOCIAL HISTORY: Social History   Socioeconomic History   Marital status: Single    Spouse name: Not on file   Number of children: Not on file   Years of education: Not on file   Highest education level: Not on file  Occupational History   Not on file  Tobacco Use   Smoking status: Never   Smokeless tobacco: Never  Substance and Sexual Activity   Alcohol use: Not Currently   Drug use: Never   Sexual activity: Never   Other Topics Concern   Not on file  Social History Narrative   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 Ph.D.  [facility] [address] [place], [place] [address] Ph: [phone] Fax: [phone]  CC:  Curtis Debby PARAS, MD [address] [place],  [place] [address]  Candise, Aleene DEL, MD

## 2023-12-23 ENCOUNTER — Ambulatory Visit: Admitting: Physical Therapy

## 2023-12-23 ENCOUNTER — Encounter: Payer: Self-pay | Admitting: Physical Therapy

## 2023-12-23 DIAGNOSIS — M25571 Pain in right ankle and joints of right foot: Secondary | ICD-10-CM

## 2023-12-23 DIAGNOSIS — M2141 Flat foot [pes planus] (acquired), right foot: Secondary | ICD-10-CM | POA: Diagnosis not present

## 2023-12-23 DIAGNOSIS — M2142 Flat foot [pes planus] (acquired), left foot: Secondary | ICD-10-CM

## 2023-12-23 NOTE — Therapy (Signed)
 OUTPATIENT PHYSICAL THERAPY LOWER EXTREMITY TREATMENT   Patient Name: Frederick Ramirez MRN: [id number] DOB:September 09, 2000, 23 y.o., male  Today's Date: 12/23/2023   END OF SESSION:  PT End of Session - 12/23/23 0858     Visit Number 15    Number of Visits 24    Date for PT Re-Evaluation 01/15/24    Authorization Type [facility]    PT Start Time 0850    PT Stop Time 0930    PT Time Calculation (min) 40 min    Activity Tolerance Patient tolerated treatment well    Behavior During Therapy [facility] for tasks assessed/performed            Past Medical History:  Diagnosis Date   Allergic rhinitis    GERD (gastroesophageal reflux disease)    Pes planus of both feet    Past Surgical History:  Procedure Laterality Date   ADENOIDECTOMY     approx 2010   Patient Active Problem List   Diagnosis Date Noted   Abnormal reflexes 10/29/2023   Chronic pain of right ankle 08/07/2023   Allergic rhinitis 01/07/2023     PCP: McGowen, Phillip`  REFERRING PROVIDER:  Debby Petties  REFERRING DIAG: Pain in r ankle   THERAPY DIAG:  Pain in right ankle and joints of right foot  Pes planus of both feet  Rationale for Evaluation and Treatment: Rehabilitation  ONSET DATE:    SUBJECTIVE:   SUBJECTIVE STATEMENT: Pt states doing well. Did a lot of standing/walking on Saturday, foot was sore at end of day and next day, but overall states little pain .    Eval: Pt states ongoing pain in R foot/ankle. States it has been painful for a long time, but has changed a bit. He used to have more pain in arch from flat foot, but in last year or so has been in lateral ankle. States increased pain since about February where he did more walking for several days. He has been wearing boot for about 6 weeks, today is end of 6 weeks, and he is wearing lace up ankle brace for driving purposes. He goes to school in [place] but will be back in town this summer starting in 2 weeks. He does lots of walking  at college which he finds to be very painful. 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 He is currently wearing OnCloud shoes, also has work boots. He has custom orthotics that are comfortable at this time.    PERTINENT HISTORY: N/A  PAIN:  Are you having pain? Yes: NPRS scale: up to 4 /10 Pain location: lateral ankle Pain description: sore,  Aggravating factors: standing, walking  Relieving factors: rest  PRECAUTIONS: None  WEIGHT BEARING RESTRICTIONS: No  FALLS:  Has patient fallen in last 6 months? No  PLOF: Independent  PATIENT GOALS:  Decreased pain In ankle.   NEXT MD VISIT:   OBJECTIVE: updated 6/27   DIAGNOSTIC FINDINGS:   PATIENT SURVEYS:    COGNITION: Overall cognitive status: Within functional limits for tasks assessed     SENSATION: WFL  EDEMA:   POSTURE:  bare feet:  Pes planus bil R>L.  R with severe over pronation and navicular drop.  Shoes on: moderate/significant improvement of alignment for R foot/ankle with orthotic and sneakers on.   PALPATION: No pain to palpate lateral ankle on R.   LOWER EXTREMITY ROM: Hips/knees: WFL L ankle: WFL R ankle: [facility]   LOWER  EXTREMITY MMT:  MMT Left eval Right  eval Left  Right  Hip flexion 4+ 4- 5 4+  Hip extension      Hip abduction 4+ 4- 5 4  Hip adduction      Hip internal rotation      Hip external rotation      Knee flexion 5 4+ 5 4+  Knee extension 5 4 5  4+  Ankle dorsiflexion      Ankle plantarflexion 4 3-  4-  Ankle inversion      Ankle eversion       (Blank rows = not tested)  LOWER EXTREMITY SPECIAL TESTS:    FUNCTIONAL TESTS:  SLS:  moderate sway on R, mild  on L.   GAIT:   TODAY'S TREATMENT:                                                                                                                              DATE:   12/23/2023 Therapeutic Exercise: Aerobic:  bike L2 x 6 min  Supine:  prom for PF/INV ;    GTB  inv/ev  x 15 ea; S/L: hip abd 2 x 10 on R;  Seated:     Standing:  heel raises x 20,  V heel raises x 15;    toe walking- 20 ft  Stretches:  Neuromuscular Re-education:  Squats : Bosu -flat side  x 20 ;  round side x 20; lateral step ups x 20;  Manual Therapy: Therapeutic Activity:    lateral band walks Gtb x 6;   Walking/march 20lb unilateral KB hold x 6;  Self Care:   PATIENT EDUCATION:  Education details: updated and reviewed HEP.  Person educated: Patient Education method: Explanation, Demonstration, Tactile cues, Verbal cues, and Handouts Education comprehension: verbalized understanding, returned demonstration, verbal cues required, tactile cues required, and needs further education   HOME EXERCISE PROGRAM: Access Code: [email or web address] URL: [email or web address] Date: 09/20/2023 Prepared by: Tinnie Don  Exercises - Supine Bridge  - 1 x daily - 4-5 x weekly - 2 sets - 10 reps - Seated Heel Raise  - 1 x daily - 4-5 x weekly - 2 sets - 10 reps - Supine Ankle Inversion AROM  - 1 x daily - 4-5 x weekly - 2 sets - 10 reps  ASSESSMENT:  CLINICAL IMPRESSION: Pt progressing well. Pt showing overall strength improvements in LE. He continues to have weakness in foot/ankle that will benefit from ongoing strengthening and HEP. He has been compliant with foot wear and orthotic. Overall doing well. He will be seen for 1 more visit prior to him going back to college.  He continues to have foot locking up in everted position, where he has to work to get it to move into INV and PF motion. We do this a few times throughout session. If foot is loosened up and mobile, he is able to perform optimal form with heel raises and strengthening. His SLS  is also limited due to lack of sway available in foot/ankle, and foot being in pronated position. Pt will discuss remaining deficits with MD at f/u tomorrow.   Eval: Patient presents with primary complaint of  pain in R lateral ankle. He  has significant over pronation on R which is likely cause of pain. He has lack of effective HEP for his Ongoing pain. He is getting good positional alignment from current orthotics and shoes, but would benefit from updated footwear, recommendations given today. He has poor ability for Inversion ROM and poor strength, and will benefit from overall strength and stability for bil foot/ankle. He will start to transition out of boot, discussed weaning out slowly as pain allows, pt states understanding.  Pt with decreased ability for full functional activities, walking, running and exercise.  Pt will  benefit from skilled PT to improve deficits and pain and to return to PLOF.   OBJECTIVE IMPAIRMENTS: Abnormal gait, decreased activity tolerance, decreased ROM, decreased strength, impaired flexibility, improper body mechanics, and pain.   ACTIVITY LIMITATIONS: standing, squatting, stairs, and locomotion level  PARTICIPATION LIMITATIONS: driving, shopping, community activity, occupation, and yard work  PERSONAL FACTORS: Past/current experiences and Time since onset of injury/illness/exacerbation are also affecting patient's functional outcome.   REHAB POTENTIAL: Good  CLINICAL DECISION MAKING: Stable/uncomplicated  EVALUATION COMPLEXITY: Low   GOALS: Goals reviewed with patient? Yes  SHORT TERM GOALS: Target date: 10/02/23  Pt to be independent with initial HEP  Goal status: MET  2.  Pt to demo ability for active inversion ROM  Goal status: MET   LONG TERM GOALS: Target date: 01/15/24  Pt to be independent with final HEP  Goal status: In progress   2.  Pt to demo optimal mechanics with standing heel raise  Goal status: In progress   3.  Pt to demo max strength and stability for bil foot/ankle, to improve gait pattern  Goal status: In progress   4.  Pt to be compliant with footwear and/or orthotics as appropriate for his foot type , for optimal alignment and pressure relief.   Goal  status: MET  5.  Pt to report decreased pain levels in R foot  to 0-2/10 to improve ability for exercise and running.   Goal status: In progress    PLAN:  PT FREQUENCY: 1-2x/week  PT DURATION: 8 weeks  PLANNED INTERVENTIONS: Therapeutic exercises, Therapeutic activity, Neuromuscular re-education, Patient/Family education, Self Care, Joint mobilization, Joint manipulation, Stair training, Orthotic/Fit training, DME instructions, Aquatic Therapy, Dry Needling, Electrical stimulation, Cryotherapy, Moist heat, Taping, Ultrasound, Ionotophoresis 4mg /ml Dexamethasone, Manual therapy,  Vasopneumatic device, Traction, Spinal manipulation, Spinal mobilization,Balance training, Gait training,   PLAN FOR NEXT SESSION:  arch 'c' in standing,  SLS, final HEP.   Tinnie Don, PT, DPT 8:59 AM  12/23/23

## 2023-12-24 ENCOUNTER — Ambulatory Visit: Admitting: Sports Medicine

## 2023-12-24 DIAGNOSIS — M25571 Pain in right ankle and joints of right foot: Secondary | ICD-10-CM | POA: Diagnosis not present

## 2023-12-24 DIAGNOSIS — G8929 Other chronic pain: Secondary | ICD-10-CM

## 2023-12-24 DIAGNOSIS — R292 Abnormal reflex: Secondary | ICD-10-CM | POA: Diagnosis not present

## 2023-12-24 NOTE — Assessment & Plan Note (Signed)
 See previous notes for extensive workup, he did get a consultation with neurology, reflexes were normal, no further investigation deemed to be necessary.

## 2023-12-24 NOTE — Progress Notes (Signed)
    Procedures performed today:    None.  Independent interpretation of notes and tests performed by another provider:   None.  Brief History, Exam, Impression, and Recommendations:    Abnormal reflexes See previous notes for extensive workup, he did get a consultation with neurology, reflexes were normal, no further investigation deemed to be necessary.  Chronic pain of right ankle Frederick Ramirez returns, he is pleasant 23 year old male, he has had quite extensive workup regarding his long history of right foot and ankle discomfort with severe pes planus, he had worked with the podiatrist in the past and was able to get some custom molded orthotics, unfortunately they were uncomfortable and did not fully correct his pes planus. He is in college, walking around school cause to severe pain that is localized laterally, he also had some difficulty inverting his ankle, it was held in an everted position, and we were unable to initially bring it to neutral. Tenderness was over the cuboid, calcaneus, and sinus tarsi. In the exam room I was able to return the ankle to neutral. We ultimately did a workup with my concern for hyperreflexia. His entire central nervous system was imaged and was normal. He has responded well to physical therapy and bracing, he can bring his foot past neutral into an inverted position on his own now. His pain continues to improve and he is functional. 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 I have suggested more of a Merrell type hiking boot, continued home conditioning, and potentially an injection if insufficient improvement.    ____________________________________________ Debby PARAS. Curtis, M.D., ABFM., CAQSM., AME. Primary Care and Sports Medicine [facility]  Adjunct Professor of [facility] Medicine  [employer]  Restaurant manager, fast food

## 2023-12-24 NOTE — Assessment & Plan Note (Signed)
 Scottie returns, he is pleasant 23 year old male, he has had quite extensive workup regarding his long history of right foot and ankle discomfort with severe pes planus, he had worked with the podiatrist in the past and was able to get some custom molded orthotics, unfortunately they were uncomfortable and did not fully correct his pes planus. He is in college, walking around school cause to severe pain that is localized laterally, he also had some difficulty inverting his ankle, it was held in an everted position, and we were unable to initially bring it to neutral. Tenderness was over the cuboid, calcaneus, and sinus tarsi. In the exam room I was able to return the ankle to neutral. We ultimately did a workup with my concern for hyperreflexia. His entire central nervous system was imaged and was normal. He has responded well to physical therapy and bracing, he can bring his foot past neutral into an inverted position on his own now. His pain continues to improve and he is functional. 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 I have suggested more of a Merrell type hiking boot, continued home conditioning, and potentially an injection if insufficient improvement.

## 2024-01-06 ENCOUNTER — Encounter: Payer: Self-pay | Admitting: Physical Therapy

## 2024-01-06 ENCOUNTER — Ambulatory Visit: Admitting: Physical Therapy

## 2024-01-06 DIAGNOSIS — M2142 Flat foot [pes planus] (acquired), left foot: Secondary | ICD-10-CM | POA: Diagnosis not present

## 2024-01-06 DIAGNOSIS — M2141 Flat foot [pes planus] (acquired), right foot: Secondary | ICD-10-CM | POA: Diagnosis not present

## 2024-01-06 DIAGNOSIS — M25571 Pain in right ankle and joints of right foot: Secondary | ICD-10-CM | POA: Diagnosis not present

## 2024-01-06 NOTE — Therapy (Signed)
 OUTPATIENT PHYSICAL THERAPY LOWER EXTREMITY TREATMENT   Patient Name: Frederick Ramirez MRN: [id number] DOB:2000/07/12, 23 y.o., male  Today's Date: 01/06/2024   END OF SESSION:  PT End of Session - 01/06/24 0851     Visit Number 16    Number of Visits 24    Date for PT Re-Evaluation 01/15/24    Authorization Type [facility]    PT Start Time 0848    PT Stop Time 0930    PT Time Calculation (min) 42 min    Activity Tolerance Patient tolerated treatment well    Behavior During Therapy WFL for tasks assessed/performed            Past Medical History:  Diagnosis Date   Allergic rhinitis    GERD (gastroesophageal reflux disease)    Pes planus of both feet    Past Surgical History:  Procedure Laterality Date   ADENOIDECTOMY     approx 2010   Patient Active Problem List   Diagnosis Date Noted   Abnormal reflexes 10/29/2023   Chronic pain of right ankle 08/07/2023   Allergic rhinitis 01/07/2023     PCP: McGowen, Phillip`  REFERRING PROVIDER:  Debby Petties  REFERRING DIAG: Pain in r ankle   THERAPY DIAG:  Pain in right ankle and joints of right foot  Pes planus of both feet  Rationale for Evaluation and Treatment: Rehabilitation  ONSET DATE:    SUBJECTIVE:   SUBJECTIVE STATEMENT: Pt states doing well. Feels he is improving and having less pain overall. Has been compliant with footwear and orthotics. He did have f/u with MD. He will be returning to college this weekend.   Eval: Pt states ongoing pain in R foot/ankle. States it has been painful for a long time, but has changed a bit. He used to have more pain in arch from flat foot, but in last year or so has been in lateral ankle. States increased pain since about February where he did more walking for several days. He has been wearing boot for about 6 weeks, today is end of 6 weeks, and he is wearing lace up ankle brace for driving purposes. He goes to school in [place] but will be back in town this  summer starting in 2 weeks. He does lots of walking at college which he finds to be very painful. 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 He is currently wearing [employer] shoes, also has work boots. He has custom orthotics that are comfortable at this time.    PERTINENT HISTORY: N/A  PAIN:  Are you having pain? Yes: NPRS scale: up to 4 /10 Pain location: lateral ankle Pain description: sore,  Aggravating factors: standing, walking  Relieving factors: rest  PRECAUTIONS: None  WEIGHT BEARING RESTRICTIONS: No  FALLS:  Has patient fallen in last 6 months? No  PLOF: Independent  PATIENT GOALS:  Decreased pain In ankle.   NEXT MD VISIT:   OBJECTIVE: updated 6/27   DIAGNOSTIC FINDINGS:   PATIENT SURVEYS:    COGNITION: Overall cognitive status: Within functional limits for tasks assessed     SENSATION: WFL  EDEMA:   POSTURE:  bare feet:  Pes planus bil R>L.  R with severe over pronation and navicular drop.  Shoes on: moderate/significant improvement of alignment for R foot/ankle with orthotic and sneakers on.   PALPATION: No pain to palpate lateral ankle on R.   LOWER EXTREMITY ROM: Hips/knees: WFL L ankle: [facility]  R ankle: WFL   LOWER EXTREMITY MMT:  MMT Left eval Right  eval Left  Right  Hip flexion 4+ 4- 5 4+  Hip extension      Hip abduction 4+ 4- 5 4  Hip adduction      Hip internal rotation      Hip external rotation      Knee flexion 5 4+ 5 4+  Knee extension 5 4 5  4+  Ankle dorsiflexion      Ankle plantarflexion 4 3-  4-  Ankle inversion      Ankle eversion       (Blank rows = not tested)  LOWER EXTREMITY SPECIAL TESTS:    FUNCTIONAL TESTS:    GAIT:   TODAY'S TREATMENT:                                                                                                                              DATE:   01/06/2024 Therapeutic Exercise: Aerobic:  bike L2 x 6 min  Supine:  prom  for PF/INV ;   GTB  inv/ev  x 15 ea; Seated:     Standing:  heel raises x 20,  Stretches:  Neuromuscular Re-education:   Manual Therapy: Therapeutic Activity:    Squats : Bosu - flat side  x 20 ;  ;  lateral band walks Gtb x 6;   Walking/march 20lb unilateral KB hold x 6;  TRX squats full depth x 15;  SL/kickstand squat TRX  3 x 5 bil;   Self Care:   PATIENT EDUCATION:  Education details: updated and reviewed HEP.  Person educated: Patient Education method: Explanation, Demonstration, Tactile cues, Verbal cues, and Handouts Education comprehension: verbalized understanding, returned demonstration, verbal cues required, tactile cues required, and needs further education   HOME EXERCISE PROGRAM: Access Code: [email or web address] URL: [email or web address] Date: 09/20/2023 Prepared by: Tinnie Don  Exercises - Supine Bridge  - 1 x daily - 4-5 x weekly - 2 sets - 10 reps - Seated Heel Raise  - 1 x daily - 4-5 x weekly - 2 sets - 10 reps - Supine Ankle Inversion AROM  - 1 x daily - 4-5 x weekly - 2 sets - 10 reps  ASSESSMENT:  CLINICAL IMPRESSION:  Pt has made good progress. He will be returning to college. He has much improved strength and stability in R LE in thigh and hip. He has improved strength in foot and calf as well, but continues to be limited with this. He has stiffness in foot, after increased activity, that keeps foot in everted position. He has stiffness present, and decreased sway available at foot and ankle, that make stability difficulty on R side vs L. Pt doing well with HEP at this time. Reviewed HEP and importance of continued mobility and strength long term. Also reviewed optimal footwear and orthotics to continue management. Pt ready for d/c to HEP at this time.   Eval: Patient presents with primary  complaint of  pain in R lateral ankle. He has significant over pronation on R which is likely cause of pain. He has lack of effective HEP for his Ongoing pain. He  is getting good positional alignment from current orthotics and shoes, but would benefit from updated footwear, recommendations given today. He has poor ability for Inversion ROM and poor strength, and will benefit from overall strength and stability for bil foot/ankle. He will start to transition out of boot, discussed weaning out slowly as pain allows, pt states understanding.  Pt with decreased ability for full functional activities, walking, running and exercise.  Pt will  benefit from skilled PT to improve deficits and pain and to return to PLOF.   OBJECTIVE IMPAIRMENTS: Abnormal gait, decreased activity tolerance, decreased ROM, decreased strength, impaired flexibility, improper body mechanics, and pain.   ACTIVITY LIMITATIONS: standing, squatting, stairs, and locomotion level  PARTICIPATION LIMITATIONS: driving, shopping, community activity, occupation, and yard work  PERSONAL FACTORS: Past/current experiences and Time since onset of injury/illness/exacerbation are also affecting patient's functional outcome.   REHAB POTENTIAL: Good  CLINICAL DECISION MAKING: Stable/uncomplicated  EVALUATION COMPLEXITY: Low   GOALS: Goals reviewed with patient? Yes  SHORT TERM GOALS: Target date: 10/02/23  Pt to be independent with initial HEP  Goal status: MET  2.  Pt to demo ability for active inversion ROM  Goal status: MET   LONG TERM GOALS: Target date: 01/15/24  Pt to be independent with final HEP  Goal status: MET  2.  Pt to demo optimal mechanics with standing heel raise  Goal status: MET  3.  Pt to demo max strength and stability for bil foot/ankle, to improve gait pattern  Goal status: MET  4.  Pt to be compliant with footwear and/or orthotics as appropriate for his foot type , for optimal alignment and pressure relief.   Goal status: MET  5.  Pt to report decreased pain levels in R foot  to 0-2/10 to improve ability for exercise and running.   Goal status: partially  met    PLAN:  PT FREQUENCY: 1-2x/week  PT DURATION: 8 weeks  PLANNED INTERVENTIONS: Therapeutic exercises, Therapeutic activity, Neuromuscular re-education, Patient/Family education, Self Care, Joint mobilization, Joint manipulation, Stair training, Orthotic/Fit training, DME instructions, Aquatic Therapy, Dry Needling, Electrical stimulation, Cryotherapy, Moist heat, Taping, Ultrasound, Ionotophoresis 4mg /ml Dexamethasone, Manual therapy,  Vasopneumatic device, Traction, Spinal manipulation, Spinal mobilization,Balance training, Gait training,   PLAN FOR NEXT SESSION:     Tinnie Don, PT, DPT 8:51 AM  01/06/24  PHYSICAL THERAPY DISCHARGE SUMMARY  Visits from Start of Care: 16   Plan: Patient agrees to discharge.  Patient goals were  met. Patient is being discharged due to meeting the stated rehab goals.    Pt returning to school this weekend. He will return as needed when he is home for breaks or if re-referred by MD.   Tinnie Don, PT, DPT 8:56 AM  01/06/24

## 2024-01-26 ENCOUNTER — Encounter: Payer: Self-pay | Admitting: Sports Medicine

## 2024-02-19 ENCOUNTER — Ambulatory Visit: Admitting: Neurology

## 2024-03-18 ENCOUNTER — Other Ambulatory Visit (HOSPITAL_BASED_OUTPATIENT_CLINIC_OR_DEPARTMENT_OTHER): Payer: Self-pay

## 2024-03-18 MED ORDER — FLUZONE 0.5 ML IM SUSY
0.5000 mL | PREFILLED_SYRINGE | Freq: Once | INTRAMUSCULAR | 0 refills | Status: AC
  Filled 2024-03-18: qty 0.5, 1d supply, fill #0
